# Patient Record
Sex: Female | Born: 1970 | Race: White | Hispanic: No | Marital: Married | Smoking: Former smoker
Health system: Southern US, Community
[De-identification: ages and names within clinical notes are randomized; demographics above are authoritative.]

## PROBLEM LIST (undated history)

## (undated) DIAGNOSIS — Z973 Presence of spectacles and contact lenses: Secondary | ICD-10-CM

## (undated) DIAGNOSIS — R519 Headache, unspecified: Secondary | ICD-10-CM

## (undated) DIAGNOSIS — O039 Complete or unspecified spontaneous abortion without complication: Secondary | ICD-10-CM

## (undated) HISTORY — DX: Complete or unspecified spontaneous abortion without complication: O03.9

## (undated) HISTORY — PX: DILATION AND CURETTAGE OF UTERUS: SHX78

## (undated) HISTORY — DX: Hypercalcemia: E83.52

---
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
## 1988-11-28 HISTORY — PX: DILATION AND CURETTAGE OF UTERUS: SHX78

## 1998-08-24 ENCOUNTER — Other Ambulatory Visit: Admission: RE | Admit: 1998-08-24 | Discharge: 1998-08-24 | Payer: Self-pay | Admitting: Obstetrics and Gynecology

## 1998-11-28 HISTORY — PX: CHOLECYSTECTOMY: SHX55

## 1999-09-01 ENCOUNTER — Inpatient Hospital Stay (HOSPITAL_COMMUNITY): Admission: AD | Admit: 1999-09-01 | Discharge: 1999-09-03 | Payer: Self-pay | Admitting: Gynecology

## 1999-10-04 ENCOUNTER — Encounter: Admission: RE | Admit: 1999-10-04 | Discharge: 2000-01-02 | Payer: Self-pay | Admitting: Gynecology

## 1999-10-12 ENCOUNTER — Other Ambulatory Visit: Admission: RE | Admit: 1999-10-12 | Discharge: 1999-10-12 | Payer: Self-pay | Admitting: Gynecology

## 2000-10-30 ENCOUNTER — Other Ambulatory Visit: Admission: RE | Admit: 2000-10-30 | Discharge: 2000-10-30 | Payer: Self-pay | Admitting: Gynecology

## 2001-11-08 ENCOUNTER — Other Ambulatory Visit: Admission: RE | Admit: 2001-11-08 | Discharge: 2001-11-08 | Payer: Self-pay | Admitting: Gynecology

## 2001-11-28 DIAGNOSIS — O039 Complete or unspecified spontaneous abortion without complication: Secondary | ICD-10-CM

## 2001-11-28 HISTORY — DX: Complete or unspecified spontaneous abortion without complication: O03.9

## 2002-12-10 ENCOUNTER — Other Ambulatory Visit: Admission: RE | Admit: 2002-12-10 | Discharge: 2002-12-10 | Payer: Self-pay | Admitting: Gynecology

## 2003-01-29 ENCOUNTER — Ambulatory Visit (HOSPITAL_COMMUNITY): Admission: RE | Admit: 2003-01-29 | Discharge: 2003-01-29 | Payer: Self-pay | Admitting: Gynecology

## 2003-01-29 ENCOUNTER — Encounter (INDEPENDENT_AMBULATORY_CARE_PROVIDER_SITE_OTHER): Payer: Self-pay

## 2003-04-21 ENCOUNTER — Encounter: Payer: Self-pay | Admitting: Gynecology

## 2003-04-21 ENCOUNTER — Ambulatory Visit (HOSPITAL_COMMUNITY): Admission: RE | Admit: 2003-04-21 | Discharge: 2003-04-21 | Payer: Self-pay | Admitting: Gynecology

## 2003-12-05 ENCOUNTER — Ambulatory Visit (HOSPITAL_COMMUNITY): Admission: RE | Admit: 2003-12-05 | Discharge: 2003-12-05 | Payer: Self-pay | Admitting: Gynecology

## 2003-12-05 ENCOUNTER — Encounter (INDEPENDENT_AMBULATORY_CARE_PROVIDER_SITE_OTHER): Payer: Self-pay | Admitting: Specialist

## 2003-12-05 ENCOUNTER — Ambulatory Visit (HOSPITAL_BASED_OUTPATIENT_CLINIC_OR_DEPARTMENT_OTHER): Admission: RE | Admit: 2003-12-05 | Discharge: 2003-12-05 | Payer: Self-pay | Admitting: Gynecology

## 2004-04-29 ENCOUNTER — Other Ambulatory Visit: Admission: RE | Admit: 2004-04-29 | Discharge: 2004-04-29 | Payer: Self-pay | Admitting: Gynecology

## 2005-02-23 ENCOUNTER — Inpatient Hospital Stay (HOSPITAL_COMMUNITY): Admission: AD | Admit: 2005-02-23 | Discharge: 2005-02-25 | Payer: Self-pay | Admitting: Gynecology

## 2005-04-19 ENCOUNTER — Other Ambulatory Visit: Admission: RE | Admit: 2005-04-19 | Discharge: 2005-04-19 | Payer: Self-pay | Admitting: Gynecology

## 2006-05-03 ENCOUNTER — Other Ambulatory Visit: Admission: RE | Admit: 2006-05-03 | Discharge: 2006-05-03 | Payer: Self-pay | Admitting: Gynecology

## 2007-05-21 ENCOUNTER — Other Ambulatory Visit: Admission: RE | Admit: 2007-05-21 | Discharge: 2007-05-21 | Payer: Self-pay | Admitting: Gynecology

## 2008-05-23 ENCOUNTER — Other Ambulatory Visit: Admission: RE | Admit: 2008-05-23 | Discharge: 2008-05-23 | Payer: Self-pay | Admitting: Gynecology

## 2009-03-30 ENCOUNTER — Encounter: Admission: RE | Admit: 2009-03-30 | Discharge: 2009-03-30 | Payer: Self-pay | Admitting: Gynecology

## 2009-04-01 ENCOUNTER — Encounter: Admission: RE | Admit: 2009-04-01 | Discharge: 2009-04-01 | Payer: Self-pay | Admitting: Gynecology

## 2009-05-25 ENCOUNTER — Other Ambulatory Visit: Admission: RE | Admit: 2009-05-25 | Discharge: 2009-05-25 | Payer: Self-pay | Admitting: Gynecology

## 2009-05-25 ENCOUNTER — Encounter: Payer: Self-pay | Admitting: Gynecology

## 2009-05-25 ENCOUNTER — Ambulatory Visit: Payer: Self-pay | Admitting: Gynecology

## 2010-06-25 ENCOUNTER — Ambulatory Visit: Payer: Self-pay | Admitting: Gynecology

## 2010-06-25 ENCOUNTER — Other Ambulatory Visit: Admission: RE | Admit: 2010-06-25 | Discharge: 2010-06-25 | Payer: Self-pay | Admitting: Gynecology

## 2011-01-10 ENCOUNTER — Other Ambulatory Visit: Payer: Self-pay | Admitting: Gynecology

## 2011-01-10 DIAGNOSIS — Z1231 Encounter for screening mammogram for malignant neoplasm of breast: Secondary | ICD-10-CM

## 2011-01-20 ENCOUNTER — Ambulatory Visit: Payer: Self-pay

## 2011-04-15 NOTE — Op Note (Signed)
   NAME:  [facility], [place]                         ACCOUNT NO.:  [id number]   MEDICAL RECORD NO.:  [id number]                   PATIENT TYPE:  AMB   LOCATION:  SDC                                  FACILITY:  [facility]   PHYSICIAN:  Ivor Costa. Farrel Gobble, M.D.              DATE OF BIRTH:  November 07, 1971   DATE OF PROCEDURE:  01/29/2003  DATE OF DISCHARGE:                                 OPERATIVE REPORT   PREOPERATIVE DIAGNOSIS:  Missed abortion at 7+ weeks with twins.   POSTOPERATIVE DIAGNOSIS:  Missed abortion at 7+ weeks with twins.   PROCEDURE:  First trimester D&E.   SURGEON:  Ivor Costa. Farrel Gobble, M.D.   ANESTHESIA:  MAC and paracervical block.   ESTIMATED BLOOD LOSS:  Minimal.   FINDINGS:  Uterus was retroverted sounding 8 cm.   PATHOLOGY:  Uterine contents and tissue for chromosome studies.   DESCRIPTION OF PROCEDURE:  The patient was taken to the operating room.  IV  sedation was induced and the patient placed in the dorsal lithotomy  position, prepped and draped in the usual sterile fashion.  A bivalve  speculum was placed in the vagina.  The cervix was injected with 10 mL of  0.25% Marcaine solution and stabilized with a single-tooth tenaculum.  The  cervix was then gently dilated up to 20 [place].  It sounded to 8 cm.  Suction curette was then advanced through the cervix and the cavity was  cleared of contents.  The gentle uterine curetting afterwards revealed good  cry on all four walls and fundus.  The suction curette was then readvanced  and no residual tissue was noted.  The instruments were removed from the  vagina.  The uterus was massaged while the patient received Methergine IM.  She was then transferred to the PACU in stable condition.                                               Ivor Costa. Farrel Gobble, M.D.    Leda Roys  D:  01/29/2003  T:  01/29/2003  Job:  [id number]

## 2011-04-15 NOTE — H&P (Signed)
NAME:  [facility]                         ACCOUNT NO.:  [id number]   MEDICAL RECORD NO.:  [id number]                   PATIENT TYPE:   LOCATION:                                       FACILITY:   PHYSICIAN:  Ivor Costa. Farrel Gobble, M.D.              DATE OF BIRTH:   DATE OF ADMISSION:  01/29/2003  DATE OF DISCHARGE:                                HISTORY & PHYSICAL   HISTORY OF PRESENT ILLNESS:  The patient is a 40 year old G3, P1, with an  LMP of 11/25/2002, estimated gestational age of 45 and 1/7 weeks, who was  noted to have monochorionic diamniotic twin gestation.  The patient reported  a small amount of brown spotting ten days ago and had an ultrasound that  showed two viable pregnancies with a small subchorionic hemorrhage.  Fetal  heart tones of 133 and 137 were seen.  She was given RhoGAM  prophylactically.  She was asked to come back in today to follow up on  subchorionic bleed.  The patient reports two days previously having a small  amount of brown spotting and generally not feeling well today.  She had some  red blood when she wiped.  Unfortunately her ultrasound done today showed  fetal demise x 2 with no fetal heart tones seen.  Of note, there was  increased nuchal thickness noted on both gestations.   PAST OB/GYN HISTORY:  She has regular periods.  She continues to have  difficulty conceiving but conceived spontaneously.  No abnormal Pap smears.  Her Rh is A negative.  Her husband is positive.  She had a spontaneous  delivery that was uncomplicated in 2000.  Her son currently ALL.   PAST MEDICAL HISTORY:  Negative.   PAST SURGICAL HISTORY:  She had a tonsillectomy in 1977 and a  cholecystectomy in 1992.   MEDICATIONS:  Prenatal vitamins.   ALLERGIES:  None.   SOCIAL HISTORY:  Negative.   FAMILY HISTORY:  Negative for cancer and recurrent ABs.   PHYSICAL EXAMINATION:  GENERAL: She is a well-appearing female in no acute  distress.  HEART:  Regular rate.  LUNGS:  Clear.  ABDOMEN:  Soft without rebound or guarding.  PELVIC:  On GYN exam, cervical motion tenderness, scant amount of blood.  The uterus was approximately 12-week size, mobile, and nontender.   ASSESSMENT:  1. Missed abortion of twin gestation at nine plus weeks.  2. Rh negative.    PLAN:  We will plan on doing D&C.  Will send sample tissue for chromosome  analysis.  She is agreeable to plan of care, and she will present in the  morning of 01/29/2003 for surgery.  Ivor Costa. Farrel Gobble, M.D.    THL/MEDQ  D:  01/28/2003  T:  01/28/2003  Job:  [id number]

## 2011-04-15 NOTE — H&P (Signed)
NAME:  Madeline Rodgers, Madeline Rodgers NO.:  [id number]   MEDICAL RECORD NO.:  [id number]                   PATIENT TYPE:   LOCATION:                                       FACILITY:   PHYSICIAN:  Juan H. Lily Peer, M.D.             DATE OF BIRTH:   DATE OF ADMISSION:  DATE OF DISCHARGE:                                HISTORY & PHYSICAL   DATE OF SURGERY:  Patient is scheduled for surgery at [facility] on December 05, 2003 at 8:30 A.M.   CHIEF COMPLAINT:  Recurrent pregnancy loses and intrauterine synechiae.   HISTORY OF PRESENT ILLNESS:  The patient is a 40 year old gravida 4, para 1,  AB 3 with history of recurrent pregnancy loses. Her first pregnancy was an  elective abortion.  The second pregnancy was a term pregnancy and the third  pregnancy was a missed abortion of twins requiring D and E and the fourth  pregnancy was spontaneously aborted.  Her work up has consisted of  antiphospholipid antibodies, IgG, IgM, lupus anticoagulant which were  negative.  She has had a karyotype which was normal.  Her husband recently  had his karyotype and results are pending at the time of this dictation.  She had an ultrasound on November 07, 2003 which was essentially  unremarkable with the exception of the saline-infusion sonohysterogram which  demonstrated irregular distention of the fundal cavity with a scarring or  synechiae in the fundal region, would not expand with saline but no  intracavitary masses were noted.  The patient was scheduled to undergo  diagnostic hysteroscopy and possible hysteroscopic lysis of intrauterine  adhesions and/or biopsy in an outpatient setting.  Literature information  from the [employer] [facility] had previously been presented to the  patient and she was seen in the office on December 04, 2003 for preoperative  counseling and had a Laminaria that was placed intracervically to facilitate  insertion of the operative  hysteroscope tomorrow during surgery.   PAST MEDICAL HISTORY:   ALLERGIES:  The patient denies any allergies.   PAST SURGICAL HISTORY:  The patient has had a tonsillectomy in 1977,  cholecystectomy in 2000.   FAMILY HISTORY:  Maternal grandmother with diabetes.   SOCIAL HISTORY:  Patient is currently using barrier contraception.   PHYSICAL EXAMINATION:  VITAL SIGNS:  Patient weight 158 pounds. Height 5  feet 8 inches tall, blood pressure 102/74.  HEENT: Unremarkable.  NECK:  Supple.  Trachea midline.  No carotid bruits. No thyromegaly.  LUNGS:  Clear to auscultation without rhonchi or wheezes.  HEART:  Regular rate and rhythm with no murmurs or gallops.  BREAST:  Examination done at the time of her annual examination in January  of 2004.  ABDOMEN:  Soft, nontender without rebound or guarding.  PELVIC:  Bartholin's, Skene's, urethral glands within normal limits.  Vagina  and cervix with no lesions or discharge.  Uterus  slightly retroverted.  Adnexa without palpable masses or tenderness.  Rectal examination deferred.  Patient had Laminaria intracervically placed.   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   PLAN:  Patient is to undergo a diagnostic hysteroscopy and possible  intrauterine adhesiolysis.  The risks, benefits and pros and cons of the  operation were discussed including infection, although she will receive  prophylaxis antibiotic, perforation of the uterus during instrumentation,  the risk of hemorrhage requiring blood transfusion with the risk of  anaphylactic reaction, hepatitis and AIDS were all discussed.  Also the risk  of fluid overload, pulmonary embolism and even death.  All these issues were  discussed with the patient.  She understands and would like to proceed with  the above-mentioned procedure and will follow accordingly.  Her last   menstrual period was reported for January 26th and as part of her  preoperative labs she will have a CBC, urinalysis, Pap smear, urine  pregnancy test and electrolytes.  Plan as per assessment above.                                               Juan H. Lily Peer, M.D.    JHF/MEDQ  D:  12/04/2003  T:  12/04/2003  Job:  [id number]

## 2011-04-15 NOTE — Op Note (Signed)
NAME:  Madeline Rodgers, Madeline Rodgers                       ACCOUNT NO.:  [id number]   MEDICAL RECORD NO.:  [id number]                   PATIENT TYPE:  AMB   LOCATION:  [facility]                                 [facility]ACILITY:  [facility]   PHYSICIAN:  Juan H. Lily Peer, M.D.             DATE OF BIRTH:  Oct 24, 1971   DATE OF PROCEDURE:  12/05/2003  DATE OF DISCHARGE:                                 OPERATIVE REPORT   INDICATIONS FOR PROCEDURE:  A 40 year old, gravida 4, para 1, AB 3 with  history of recurrent pregnancy losses.  Sonohysterogram in the office  demonstrated intrauterine synechia.   PREOPERATIVE DIAGNOSES:  1. Recurrent pregnancy losses.  2. Intrauterine synechia.   POSTOPERATIVE DIAGNOSES:  1. Recurrent pregnancy losses.  2. Intrauterine synechia.   ANESTHESIA:  General endotracheal anesthesia.   PROCEDURE:  1. Diagnostic hysteroscopy.  2. Lysis of intrauterine adhesions.   DESCRIPTION OF PROCEDURE:  After the patient was adequately counseled, she  was taken to the operating room where she underwent a successful general  endotracheal anesthesia.  She was placed in low lithotomy position. She had  received a gram of Cefotan prophylactically before commencement of the  operation.  The Laminaria that had previously been placed intracervically in  the office the day before in an effort to facilitate the hysteroscopic  procedure was removed in the operating room prior to commencement of the  operation.  The cervix and vagina was cleansed with Betadine solution, a red  rubber Roxan Hockey was used to evacuate the bladder of its contents for  approximately 75 mL.  Examination under anesthesia demonstrated a normal  size uterus, retroverted with no palpable adnexal masses. A long weighted  speculum was inserted into the vaginal vault along with a Simms retractor  for exposure. The anterior cervical lip was grasped with a single tooth  tenaculum.  The uterus was retroverted and it sounded to  approximately 7-8  cm.  The diagnostic hysteroscope with 3% sorbitol as the distending media  was utilized to visualize the cavity. There was a significant amount of  intrauterine synechia that were noted and due to the fact that the patient  is in proliferative phase of her cycle, enlarged endometrium was starting to  become evident. This required switching the hysteroscope to the operative  coiled Storz resectoscope with a 90 degree wire loop and with a [facility] generator set at 70 watts in cutting and 70 on the  coagulation mode.  The intrauterine pressure was kept between 90-100 mmHg  and the operative resectoscope facilitated the removal of small polypoid  like lesions.  A thick band was noted in the fundal aspect of the uterus  which was transected and representative portion was submitted for  histological evaluation. The right tubal ostia was identified and left tubal  ostia due to the lush endometrium was difficult to visualize.  Pre and post  pictures were obtained and a copy  will be kept in the patient's hospital  record and a second set will be kept in the office to show the patient after  surgery.  The endocervical canal was smooth, the fluid deficit from the 3%  sorbitol distending medium was only 20 mL and IV fluids were 700 mL and  lactated Ringer's.  For expansion of the intrauterine cavity, a 30 mL Foley  catheter with the tip cut off was placed into the uterus for additional  tamponade and will be left with  the patient in the recovery room for 2-3 hours before discharge.  The  patient was extubated and transferred to the recovery room with stable vital  signs.  Blood loss was minimal. Fluid resuscitation consisted of 700 mL of  lactated Ringer's.                                               Juan H. Lily Peer, M.D.    JHF/MEDQ  D:  12/05/2003  T:  12/05/2003  Job:  [id number]

## 2011-05-02 ENCOUNTER — Ambulatory Visit
Admission: RE | Admit: 2011-05-02 | Discharge: 2011-05-02 | Disposition: A | Discharge status: Home / Self Care | Payer: BC Managed Care – PPO | Source: Ambulatory Visit | Attending: Gynecology | Admitting: Gynecology

## 2011-05-02 DIAGNOSIS — Z1231 Encounter for screening mammogram for malignant neoplasm of breast: Secondary | ICD-10-CM

## 2011-06-03 ENCOUNTER — Encounter: Payer: Self-pay | Admitting: *Deleted

## 2012-05-10 ENCOUNTER — Encounter: Payer: BC Managed Care – PPO | Admitting: Gynecology

## 2012-05-10 ENCOUNTER — Ambulatory Visit (INDEPENDENT_AMBULATORY_CARE_PROVIDER_SITE_OTHER): Payer: BC Managed Care – PPO | Admitting: Gynecology

## 2012-05-10 ENCOUNTER — Encounter: Payer: Self-pay | Admitting: Gynecology

## 2012-05-10 VITALS — BP 112/78 | Ht 68.25 in | Wt 162.0 lb

## 2012-05-10 DIAGNOSIS — Z01419 Encounter for gynecological examination (general) (routine) without abnormal findings: Secondary | ICD-10-CM

## 2012-05-10 DIAGNOSIS — N83201 Unspecified ovarian cyst, right side: Secondary | ICD-10-CM | POA: Insufficient documentation

## 2012-05-10 DIAGNOSIS — M549 Dorsalgia, unspecified: Secondary | ICD-10-CM

## 2012-05-10 DIAGNOSIS — N83209 Unspecified ovarian cyst, unspecified side: Secondary | ICD-10-CM

## 2012-05-10 LAB — COMPREHENSIVE METABOLIC PANEL
ALT: 15 U/L (ref 0–35)
AST: 20 U/L (ref 0–37)
Alkaline Phosphatase: 33 U/L — ABNORMAL LOW (ref 39–117)
BUN: 13 mg/dL (ref 6–23)
Creat: 0.88 mg/dL (ref 0.50–1.10)
Potassium: 4.9 mEq/L (ref 3.5–5.3)

## 2012-05-10 LAB — CBC WITH DIFFERENTIAL/PLATELET
Basophils Absolute: 0 10*3/uL (ref 0.0–0.1)
Basophils Relative: 1 % (ref 0–1)
Eosinophils Relative: 2 % (ref 0–5)
HCT: 44.4 % (ref 36.0–46.0)
MCHC: 33.3 g/dL (ref 30.0–36.0)
Monocytes Absolute: 0.5 10*3/uL (ref 0.1–1.0)
Neutro Abs: 4.1 10*3/uL (ref 1.7–7.7)
Platelets: 304 10*3/uL (ref 150–400)
RDW: 13.7 % (ref 11.5–15.5)
WBC: 6.4 10*3/uL (ref 4.0–10.5)

## 2012-05-10 NOTE — Progress Notes (Signed)
Madeline Rodgers [birth year] [id number]   History:    41 y.o.  for annual gyn exam who has not been seen in the office since 2011. She has always had normal Pap smears. She is no longer on oral contraceptive pills. She is using condoms for contraception. Her cycles are reported to be regular. She plays tennis regularly and at times has complained of low back discomfort. No fever chills nausea vomiting or dysuria or frequency reported. Review of her records indicated that in 2011 she had an ultrasound that demonstrated that she had a right ovarian complex thick walled vascular cyst measuring 23 x 21 x 17 mm it was instructed to return back in 3 months for followup ultrasound and she did not.  Past medical history,surgical history, family history and social history were all reviewed and documented in the EPIC chart.  Gynecologic History Patient's last menstrual period was 04/19/2012. Contraception: condoms Last Pap: 2011. Results were: normal Last mammogram: 2012. Results were: normal  Obstetric History OB History    Grav Para Term Preterm Abortions TAB SAB Ect Mult Living   4 2 2  1  1   2      # Outc Date GA Lbr Len/2nd Wgt Sex Del Anes PTL Lv   1 GRA            2 TRM     M SVD  No Yes   3 TRM     M SVD  No Yes   4 SAB                ROS: A ROS was performed and pertinent positives and negatives are included in the history.  GENERAL: No fevers or chills. HEENT: No change in vision, no earache, sore throat or sinus congestion. NECK: No pain or stiffness. CARDIOVASCULAR: No chest pain or pressure. No palpitations. PULMONARY: No shortness of breath, cough or wheeze. GASTROINTESTINAL: No abdominal pain, nausea, vomiting or diarrhea, melena or bright red blood per rectum. GENITOURINARY: No urinary frequency, urgency, hesitancy or dysuria. MUSCULOSKELETAL: No joint or muscle pain, no back pain, no recent trauma. DERMATOLOGIC: No rash, no itching, no lesions. ENDOCRINE: No polyuria, polydipsia, no  heat or cold intolerance. No recent change in weight. HEMATOLOGICAL: No anemia or easy bruising or bleeding. NEUROLOGIC: No headache, seizures, numbness, tingling or weakness. PSYCHIATRIC: No depression, no loss of interest in normal activity or change in sleep pattern.     Exam: chaperone present  BP 112/78  Ht 5' 8.25" (1.734 m)  Wt 162 lb (73.483 kg)  BMI 24.45 kg/m2  LMP 04/19/2012  Body mass index is 24.45 kg/(m^2).  General appearance : Well developed well nourished female. No acute distress HEENT: Neck supple, trachea midline, no carotid bruits, no thyroidmegaly Lungs: Clear to auscultation, no rhonchi or wheezes, or rib retractions  Heart: Regular rate and rhythm, no murmurs or gallops Breast:Examined in sitting and supine position were symmetrical in appearance, no palpable masses or tenderness,  no skin retraction, no nipple inversion, no nipple discharge, no skin discoloration, no axillary or supraclavicular lymphadenopathy Abdomen: no palpable masses or tenderness, no rebound or guarding Extremities: no edema or skin discoloration or tenderness  Pelvic:  Bartholin, Urethra, Skene Glands: Within normal limits             Vagina: No gross lesions or discharge  Cervix: No gross lesions or discharge  Uterus  anteverted, normal size, shape and consistency, non-tender and mobile  Adnexa  Without masses or tenderness  Anus and perineum  normal   Rectovaginal  normal sphincter tone without palpated masses or tenderness             Hemoccult not done     Assessment/Plan:  41 y.o. female for annual exam will return to the office in 2-3 weeks for followup ultrasound. We discussed new screening guidelines her Pap smears she will not need one until next year. The following labs were drawn today: CBC, cholesterol, comprehensive metabolic, urinalysis, and TSH. Requisition to schedule mammogram was provided. She was instructed to continue to do her monthly self breast examination. We  discussed importance of calcium and vitamin D for osteoporosis prevention.    Ok Edwards MD, 11:09 AM 05/10/2012

## 2012-05-10 NOTE — Addendum Note (Signed)
Addended by: Bertram Savin A on: 05/10/2012 11:32 AM   Modules accepted: Orders

## 2012-05-10 NOTE — Patient Instructions (Addendum)
Ovarian Cyst The ovaries are small organs that are on each side of the uterus. The ovaries are the organs that produce the female hormones, estrogen and progesterone. An ovarian cyst is a sac filled with fluid that can vary in its size. It is normal for a small cyst to form in women who are in the childbearing age and who have menstrual periods. This type of cyst is called a follicle cyst that becomes an ovulation cyst (corpus luteum cyst) after it produces the women's egg. It later goes away on its own if the woman does not become pregnant. There are other kinds of ovarian cysts that may cause problems and may need to be treated. The most serious problem is a cyst with cancer. It should be noted that menopausal women who have an ovarian cyst are at a higher risk of it being a cancer cyst. They should be evaluated very quickly, thoroughly and followed closely. This is especially true in menopausal women because of the high rate of ovarian cancer in women in menopause. CAUSES AND TYPES OF OVARIAN CYSTS:  FUNCTIONAL CYST: The follicle/corpus luteum cyst is a functional cyst that occurs every month during ovulation with the menstrual cycle. They go away with the next menstrual cycle if the woman does not get pregnant. Usually, there are no symptoms with a functional cyst.   ENDOMETRIOMA CYST: This cyst develops from the lining of the uterus tissue. This cyst gets in or on the ovary. It grows every month from the bleeding during the menstrual period. It is also called a "chocolate cyst" because it becomes filled with blood that turns brown. This cyst can cause pain in the lower abdomen during intercourse and with your menstrual period.   CYSTADENOMA CYST: This cyst develops from the cells on the outside of the ovary. They usually are not cancerous. They can get very big and cause lower abdomen pain and pain with intercourse. This type of cyst can twist on itself, cut off its blood supply and cause severe pain.  It also can easily rupture and cause a lot of pain.   DERMOID CYST: This type of cyst is sometimes found in both ovaries. They are found to have different kinds of body tissue in the cyst. The tissue includes skin, teeth, hair, and/or cartilage. They usually do not have symptoms unless they get very big. Dermoid cysts are rarely cancerous.   POLYCYSTIC OVARY: This is a rare condition with hormone problems that produces many small cysts on both ovaries. The cysts are follicle-like cysts that never produce an egg and become a corpus luteum. It can cause an increase in body weight, infertility, acne, increase in body and facial hair and lack of menstrual periods or rare menstrual periods. Many women with this problem develop type 2 diabetes. The exact cause of this problem is unknown. A polycystic ovary is rarely cancerous.   THECA LUTEIN CYST: Occurs when too much hormone (human chorionic gonadotropin) is produced and over-stimulates the ovaries to produce an egg. They are frequently seen when doctors stimulate the ovaries for invitro-fertilization (test tube babies).   LUTEOMA CYST: This cyst is seen during pregnancy. Rarely it can cause an obstruction to the birth canal during labor and delivery. They usually go away after delivery.  SYMPTOMS   Pelvic pain or pressure.   Pain during sexual intercourse.   Increasing girth (swelling) of the abdomen.   Abnormal menstrual periods.   Increasing pain with menstrual periods.   You stop having   menstrual periods and you are not pregnant.  DIAGNOSIS  The diagnosis can be made during:  Routine or annual pelvic examination (common).   Ultrasound.   X-ray of the pelvis.   CT Scan.   MRI.   Blood tests.  TREATMENT   Treatment may only be to follow the cyst monthly for 2 to 3 months with your caregiver. Many go away on their own, especially functional cysts.   May be aspirated (drained) with a long needle with ultrasound, or by laparoscopy  (inserting a tube into the pelvis through a small incision).   The whole cyst can be removed by laparoscopy.   Sometimes the cyst may need to be removed through an incision in the lower abdomen.   Hormone treatment is sometimes used to help dissolve certain cysts.   Birth control pills are sometimes used to help dissolve certain cysts.  HOME CARE INSTRUCTIONS  Follow your caregiver's advice regarding:  Medicine.   Follow up visits to evaluate and treat the cyst.   You may need to come back or make an appointment with another caregiver, to find the exact cause of your cyst, if your caregiver is not a gynecologist.   Get your yearly and recommended pelvic examinations and Pap tests.   Let your caregiver know if you have had an ovarian cyst in the past.  SEEK MEDICAL CARE IF:   Your periods are late, irregular, they stop, or are painful.   Your stomach (abdomen) or pelvic pain does not go away.   Your stomach becomes larger or swollen.   You have pressure on your bladder or trouble emptying your bladder completely.   You have painful sexual intercourse.   You have feelings of fullness, pressure, or discomfort in your stomach.   You lose weight for no apparent reason.   You feel generally ill.   You become constipated.   You lose your appetite.   You develop acne.   You have an increase in body and facial hair.   You are gaining weight, without changing your exercise and eating habits.   You think you are pregnant.  SEEK IMMEDIATE MEDICAL CARE IF:   You have increasing abdominal pain.   You feel sick to your stomach (nausea) and/or vomit.   You develop a fever that comes on suddenly.   You develop abdominal pain during a bowel movement.   Your menstrual periods become heavier than usual.  Document Released: 11/14/2005 Document Revised: 11/03/2011 Document Reviewed: 09/17/2009 [employer] Patient Information 2012 [employer]. 

## 2012-05-11 ENCOUNTER — Other Ambulatory Visit: Payer: Self-pay | Admitting: *Deleted

## 2012-05-11 DIAGNOSIS — E7800 Pure hypercholesterolemia, unspecified: Secondary | ICD-10-CM

## 2012-05-11 LAB — URINALYSIS W MICROSCOPIC + REFLEX CULTURE
Casts: NONE SEEN
Crystals: NONE SEEN
Glucose, UA: NEGATIVE mg/dL
Leukocytes, UA: NEGATIVE
Squamous Epithelial / LPF: NONE SEEN
pH: 6 (ref 5.0–8.0)

## 2012-05-30 ENCOUNTER — Other Ambulatory Visit: Payer: Self-pay | Admitting: Gynecology

## 2012-05-30 DIAGNOSIS — Z1231 Encounter for screening mammogram for malignant neoplasm of breast: Secondary | ICD-10-CM

## 2012-06-04 ENCOUNTER — Ambulatory Visit (INDEPENDENT_AMBULATORY_CARE_PROVIDER_SITE_OTHER): Payer: BC Managed Care – PPO

## 2012-06-04 ENCOUNTER — Ambulatory Visit (INDEPENDENT_AMBULATORY_CARE_PROVIDER_SITE_OTHER): Payer: BC Managed Care – PPO | Admitting: Gynecology

## 2012-06-04 ENCOUNTER — Encounter: Payer: Self-pay | Admitting: Gynecology

## 2012-06-04 ENCOUNTER — Ambulatory Visit
Admission: RE | Admit: 2012-06-04 | Discharge: 2012-06-04 | Disposition: A | Discharge status: Home / Self Care | Payer: BC Managed Care – PPO | Source: Ambulatory Visit | Attending: Gynecology | Admitting: Gynecology

## 2012-06-04 VITALS — BP 124/82

## 2012-06-04 DIAGNOSIS — D251 Intramural leiomyoma of uterus: Secondary | ICD-10-CM

## 2012-06-04 DIAGNOSIS — D259 Leiomyoma of uterus, unspecified: Secondary | ICD-10-CM

## 2012-06-04 DIAGNOSIS — Z1231 Encounter for screening mammogram for malignant neoplasm of breast: Secondary | ICD-10-CM

## 2012-06-04 DIAGNOSIS — N83201 Unspecified ovarian cyst, right side: Secondary | ICD-10-CM

## 2012-06-04 DIAGNOSIS — N854 Malposition of uterus: Secondary | ICD-10-CM

## 2012-06-04 DIAGNOSIS — N83209 Unspecified ovarian cyst, unspecified side: Secondary | ICD-10-CM

## 2012-06-04 DIAGNOSIS — D219 Benign neoplasm of connective and other soft tissue, unspecified: Secondary | ICD-10-CM

## 2012-06-04 NOTE — Progress Notes (Signed)
Patient was seen the office on June 13 for an overdue annual exam. But at the time of her records were reviewed and indicated that in 2011 she had an ultrasound that demonstrated that she had a right ovarian complex thick walled vascular cyst measuring 23 x 21 x 17 mm it was instructed to return back in 3 months for followup ultrasound and she did not.  Patient returned today for followup for sound and the following were the findings: Uterus measured 8.5 x 6.2 x 5.1 cm with an endometrial stripe of 5.7 mm patient is currently on day 14 of her cycle. She has a left thick wall collapse corpus luteum cyst measures 17 x 15 x 40 mm. Right ovary with follicles no abnormality noted. 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  Her recent labs and care total cholesterol was elevated 203 and she will return back later this week for fasting lipid profile. We will otherwise see her next year or when necessary. She was reassured and information provided on fibroids.

## 2012-06-04 NOTE — Patient Instructions (Addendum)
Fibroids You have been diagnosed as having a fibroid. Fibroids are smooth muscle lumps (tumors) which can occur any place in a woman's body. They are usually in the womb (uterus). The most common problem (symptom) of fibroids is bleeding. Over time this may cause low red blood cells (anemia). Other symptoms include feelings of pressure and pain in the pelvis. The diagnosis (learning what is wrong) of fibroids is made by physical exam. Sometimes tests such as an ultrasound are used. This is helpful when fibroids are felt around the ovaries and to look for tumors. TREATMENT   Most fibroids do not need surgical or medical treatment. Sometimes a tissue sample (biopsy) of the lining of the uterus is done to rule out cancer. If there is no cancer and only a small amount of bleeding, the problem can be watched.   Hormonal treatment can improve the problem.   When surgery is needed, it can consist of removing the fibroid. Vaginal birth may not be possible after the removal of fibroids. This depends on where they are and the extent of surgery. When pregnancy occurs with fibroids it is usually normal.   Your caregiver can help decide which treatments are best for you.  HOME CARE INSTRUCTIONS   Do not use aspirin as this may increase bleeding problems.   If your periods (menses) are heavy, record the number of pads or tampons used per month. Bring this information to your caregiver. This can help them determine the best treatment for you.  SEEK IMMEDIATE MEDICAL CARE IF:  You have pelvic pain or cramps not controlled with medications, or experience a sudden increase in pain.   You have an increase of pelvic bleeding between and during menses.   You feel lightheaded or have fainting spells.   You develop worsening belly (abdominal) pain.  Document Released: 11/11/2000 Document Revised: 11/03/2011 Document Reviewed: 07/03/2008 Exit[employer] Patient Information 2012 [employer].    

## 2013-01-24 ENCOUNTER — Ambulatory Visit: Payer: BC Managed Care – PPO | Admitting: Gynecology

## 2013-07-05 ENCOUNTER — Encounter: Payer: Self-pay | Admitting: Gynecology

## 2013-07-30 ENCOUNTER — Other Ambulatory Visit: Payer: Self-pay

## 2013-07-30 DIAGNOSIS — Z1231 Encounter for screening mammogram for malignant neoplasm of breast: Secondary | ICD-10-CM

## 2013-08-20 ENCOUNTER — Encounter: Payer: Self-pay | Admitting: Gynecology

## 2013-08-27 ENCOUNTER — Ambulatory Visit: Payer: BC Managed Care – PPO

## 2013-08-30 ENCOUNTER — Other Ambulatory Visit (HOSPITAL_COMMUNITY)
Admission: RE | Admit: 2013-08-30 | Discharge: 2013-08-30 | Disposition: A | Discharge status: Home / Self Care | Payer: BC Managed Care – PPO | Source: Ambulatory Visit | Attending: Gynecology | Admitting: Gynecology

## 2013-08-30 ENCOUNTER — Encounter: Payer: Self-pay | Admitting: Gynecology

## 2013-08-30 ENCOUNTER — Ambulatory Visit (INDEPENDENT_AMBULATORY_CARE_PROVIDER_SITE_OTHER): Payer: BC Managed Care – PPO | Admitting: Gynecology

## 2013-08-30 VITALS — BP 124/78 | Ht 68.0 in | Wt 169.0 lb

## 2013-08-30 DIAGNOSIS — Z23 Encounter for immunization: Secondary | ICD-10-CM

## 2013-08-30 DIAGNOSIS — Z1151 Encounter for screening for human papillomavirus (HPV): Secondary | ICD-10-CM | POA: Insufficient documentation

## 2013-08-30 DIAGNOSIS — Z01419 Encounter for gynecological examination (general) (routine) without abnormal findings: Secondary | ICD-10-CM

## 2013-08-30 DIAGNOSIS — R635 Abnormal weight gain: Secondary | ICD-10-CM

## 2013-08-30 DIAGNOSIS — N898 Other specified noninflammatory disorders of vagina: Secondary | ICD-10-CM

## 2013-08-30 DIAGNOSIS — L292 Pruritus vulvae: Secondary | ICD-10-CM

## 2013-08-30 DIAGNOSIS — L293 Anogenital pruritus, unspecified: Secondary | ICD-10-CM

## 2013-08-30 LAB — COMPREHENSIVE METABOLIC PANEL
Alkaline Phosphatase: 34 U/L — ABNORMAL LOW (ref 39–117)
BUN: 14 mg/dL (ref 6–23)
Glucose, Bld: 87 mg/dL (ref 70–99)
Total Bilirubin: 0.5 mg/dL (ref 0.3–1.2)

## 2013-08-30 LAB — WET PREP FOR TRICH, YEAST, CLUE
Clue Cells Wet Prep HPF POC: NONE SEEN
Clue Cells Wet Prep HPF POC: NONE SEEN
Trich, Wet Prep: NONE SEEN
Yeast Wet Prep HPF POC: NONE SEEN

## 2013-08-30 LAB — CBC WITH DIFFERENTIAL/PLATELET
Basophils Relative: 0 % (ref 0–1)
Eosinophils Absolute: 0.1 10*3/uL (ref 0.0–0.7)
Hemoglobin: 13.6 g/dL (ref 12.0–15.0)
MCH: 32 pg (ref 26.0–34.0)
MCHC: 34.8 g/dL (ref 30.0–36.0)
Monocytes Absolute: 0.5 10*3/uL (ref 0.1–1.0)
Monocytes Relative: 9 % (ref 3–12)
Neutrophils Relative %: 63 % (ref 43–77)

## 2013-08-30 LAB — TSH: TSH: 1.042 u[IU]/mL (ref 0.350–4.500)

## 2013-08-30 MED ORDER — FLUCONAZOLE 150 MG PO TABS: 150.0000 mg | ORAL_TABLET | Freq: Once | ORAL | Status: DC

## 2013-08-30 NOTE — Patient Instructions (Addendum)
Influenza Vaccine (Flu Vaccine, Inactivated) 2013 2014 What You Need to Know WHY GET VACCINATED?  Influenza ("flu") is a contagious disease that spreads around the [place] every winter, usually between October and May.  Flu is caused by the influenza virus, and can be spread by coughing, sneezing, and close contact.  Anyone can get flu, but the risk of getting flu is highest among children. Symptoms come on suddenly and may last several days. They can include:  Fever or chills.  Sore throat.  Muscle aches.  Fatigue.  Cough.  Headache.  Runny or stuffy nose. Flu can make some people much sicker than others. These people include young children, people 65 and older, pregnant women, and people with certain health conditions such as heart, lung or kidney disease, or a weakened immune system. Flu vaccine is especially important for these people, and anyone in close contact with them. Flu can also lead to pneumonia, and make existing medical conditions worse. It can cause diarrhea and seizures in children. Each year thousands of people in the [place] die from flu, and many more are hospitalized. Flu vaccine is the best protection we have from flu and its complications. Flu vaccine also helps prevent spreading flu from person to person. INACTIVATED FLU VACCINE There are 2 types of influenza vaccine:  You are getting an inactivated flu vaccine, which does not contain any live influenza virus. It is given by injection with a needle, and often called the "flu shot."  A different live, attenuated (weakened) influenza vaccine is sprayed into the nostrils. This vaccine is described in a separate Vaccine Information Statement. Flu vaccine is recommended every year. Children 6 months through 8 years of age should get 2 doses the first year they get vaccinated. Flu viruses are always changing. Each year's flu vaccine is made to protect from viruses that are most likely to cause disease  that year. While flu vaccine cannot prevent all cases of flu, it is our best defense against the disease. Inactivated flu vaccine protects against 3 or 4 different influenza viruses. It takes about 2 weeks for protection to develop after the vaccination, and protection lasts several months to a year. Some illnesses that are not caused by influenza virus are often mistaken for flu. Flu vaccine will not prevent these illnesses. It can only prevent influenza. A "high-dose" flu vaccine is available for people 65 years of age and older. The person giving you the vaccine can tell you more about it. Some inactivated flu vaccine contains a very small amount of a mercury-based preservative called thimerosal. Studies have shown that thimerosal in vaccines is not harmful, but flu vaccines that do not contain a preservative are available. SOME PEOPLE SHOULD NOT GET THIS VACCINE Tell the person who gives you the vaccine:  If you have any severe (life-threatening) allergies. If you ever had a life-threatening allergic reaction after a dose of flu vaccine, or have a severe allergy to any part of this vaccine, you may be advised not to get a dose. Most, but not all, types of flu vaccine contain a small amount of egg.  If you ever had Guillain Barr Syndrome (a severe paralyzing illness, also called GBS). Some people with a history of GBS should not get this vaccine. This should be discussed with your doctor.  If you are not feeling well. They might suggest waiting until you feel better. But you should come back. RISKS OF A VACCINE REACTION With a vaccine, like any medicine, there   is a chance of side effects. These are usually mild and go away on their own. Serious side effects are also possible, but are very rare. Inactivated flu vaccine does not contain live flu virus, sogetting flu from this vaccine is not possible. Brief fainting spells and related symptoms (such as jerking movements) can happen after any medical  procedure, including vaccination. Sitting or lying down for about 15 minutes after a vaccination can help prevent fainting and injuries caused by falls. Tell your doctor if you feel dizzy or lightheaded, or have vision changes or ringing in the ears. Mild problems following inactivated flu vaccine:  Soreness, redness, or swelling where the shot was given.  Hoarseness; sore, red or itchy eyes; or cough.  Fever.  Aches.  Headache.  Itching.  Fatigue. If these problems occur, they usually begin soon after the shot and last 1 or 2 days. Moderate problems following inactivated flu vaccine:  Young children who get inactivated flu vaccine and pneumococcal vaccine (PCV13) at the same time may be at increased risk for seizures caused by fever. Ask your doctor for more information. Tell your doctor if a child who is getting flu vaccine has ever had a seizure. Severe problems following inactivated flu vaccine:  A severe allergic reaction could occur after any vaccine (estimated less than 1 in a million doses).  There is a small possibility that inactivated flu vaccine could be associated with Guillan Barr Syndrome (GBS), no more than 1 or 2 cases per million people vaccinated. This is much lower than the risk of severe complications from flu, which can be prevented by flu vaccine. The safety of vaccines is always being monitored. For more information, visit: www.cdc.gov/vaccinesafety/ WHAT IF THERE IS A SERIOUS REACTION? What should I look for?  Look for anything that concerns you, such as signs of a severe allergic reaction, very high fever, or behavior changes. Signs of a severe allergic reaction can include hives, swelling of the face and throat, difficulty breathing, a fast heartbeat, dizziness, and weakness. These would start a few minutes to a few hours after the vaccination. What should I do?  If you think it is a severe allergic reaction or other emergency that cannot wait, call [phone] 1 1  or get the person to the nearest hospital. Otherwise, call your doctor.  Afterward, the reaction should be reported to the Vaccine Adverse Event Reporting System (VAERS). Your doctor might file this report, or you can do it yourself through the VAERS website at www.vaers.hhs.gov, or by calling 1-800-822-7967. VAERS is only for reporting reactions. They do not give medical advice. THE NATIONAL VACCINE INJURY COMPENSATION PROGRAM The National Vaccine Injury Compensation Program (VICP) is a federal program that was created to compensate people who may have been injured by certain vaccines. Persons who believe they may have been injured by a vaccine can learn about the program and about filing a claim by calling 1-800-338-2382 or visiting the VICP website at www.hrsa.gov/vaccinecompensation HOW CAN I LEARN MORE?  Ask your doctor.  Call your local or state health department.  Contact the Centers for Disease Control and Prevention (CDC):  Call 1-800-232-4636 (1-800-CDC-INFO) or  Visit CDC's website at www.cdc.gov/flu CDC Inactivated Influenza Vaccine Interim VIS (06/22/12) Document Released: 09/08/2006 Document Revised: 08/08/2012 Document Reviewed: 06/22/2012 [employer] Patient Information 2014 [employer].  

## 2013-08-30 NOTE — Progress Notes (Signed)
Madeline Rodgers 01-04-1971 [id number]   History:    42 y.o.  for annual gyn exam complaining of some vaginal irritation. She tried  Monistat for about 5 days but still has some other symptoms. Review of patient's records indicated she was weighing 162 and is up to 169. She has been off the oral contraceptive for 4 years and is using withdrawal for contraception. She reports normal menstrual cycles. Last mammogram 2013. Patient with no prior history of abnormal Pap smears.  Past medical history,surgical history, family history and social history were all reviewed and documented in the EPIC chart.  Gynecologic History Patient's last menstrual period was 08/25/2013. Contraception: none Last Pap: 2011. Results were: normal Last mammogram: 2013. Results were: normal  Obstetric History OB History  Gravida Para Term Preterm AB SAB TAB Ectopic Multiple Living  4 2 2  1 1    2     # Outcome Date GA Lbr Len/2nd Weight Sex Delivery Anes PTL Lv  4 SAB           3 TRM     M SVD  N Y  2 TRM     M SVD  N Y  1 GRA                ROS: A ROS was performed and pertinent positives and negatives are included in the history.  GENERAL: No fevers or chills. HEENT: No change in vision, no earache, sore throat or sinus congestion. NECK: No pain or stiffness. CARDIOVASCULAR: No chest pain or pressure. No palpitations. PULMONARY: No shortness of breath, cough or wheeze. GASTROINTESTINAL: No abdominal pain, nausea, vomiting or diarrhea, melena or bright red blood per rectum. GENITOURINARY: No urinary frequency, urgency, hesitancy or dysuria. MUSCULOSKELETAL: No joint or muscle pain, no back pain, no recent trauma. DERMATOLOGIC: No rash, no itching, no lesions. ENDOCRINE: No polyuria, polydipsia, no heat or cold intolerance. No recent change in weight. HEMATOLOGICAL: No anemia or easy bruising or bleeding. NEUROLOGIC: No headache, seizures, numbness, tingling or weakness. PSYCHIATRIC: No depression, no loss of interest  in normal activity or change in sleep pattern.   vulvar irritation  Exam: chaperone present  BP 124/78  Ht 5\' 8"  (1.727 m)  Wt 169 lb (76.658 kg)  BMI 25.7 kg/m2  LMP 08/25/2013  Body mass index is 25.7 kg/(m^2).  General appearance : Well developed well nourished female. No acute distress HEENT: Neck supple, trachea midline, no carotid bruits, no thyroidmegaly Lungs: Clear to auscultation, no rhonchi or wheezes, or rib retractions  Heart: Regular rate and rhythm, no murmurs or gallops Breast:Examined in sitting and supine position were symmetrical in appearance, no palpable masses or tenderness,  no skin retraction, no nipple inversion, no nipple discharge, no skin discoloration, no axillary or supraclavicular lymphadenopathy Abdomen: no palpable masses or tenderness, no rebound or guarding Extremities: no edema or skin discoloration or tenderness  Pelvic:  Bartholin, Urethra, Skene Glands: Within normal limits             Vagina: No gross lesions or discharge  Cervix: No gross lesions or discharge  Uterus  anteverted, normal size, shape and consistency, non-tender and mobile  Adnexa  Without masses or tenderness  Anus and perineum  normal   Rectovaginal  normal sphincter tone without palpated masses or tenderness             Hemoccult none indicated   Wet prep   Assessment/Plan:  42 y.o. female for annual exam with evidence of vaginal  monilial infection. Diflucan 150 mg was prescribed. Pap smear was done today. The following labs were ordered: CBC, conference metabolic panel, TSH and urinalysis. Patient received the flu vaccine today. Patient reminded to schedule her mammogram and to do her monthly breast exam. We discussed importance of calcium vitamin D and regular exercise for osteoporosis prevention.  Note: This dictation was prepared with  Dragon/digital dictation along withSmart phrase technology. Any transcriptional errors that result from this process are  unintentional.   Ok Edwards MD, 3:43 PM 08/30/2013

## 2013-08-30 NOTE — Addendum Note (Signed)
Addended by: Bertram Savin A on: 08/30/2013 04:38 PM   Modules accepted: Orders

## 2013-08-31 LAB — URINALYSIS W MICROSCOPIC + REFLEX CULTURE
Bacteria, UA: NONE SEEN
Bilirubin Urine: NEGATIVE
Casts: NONE SEEN
Crystals: NONE SEEN
Glucose, UA: NEGATIVE mg/dL
Hgb urine dipstick: NEGATIVE
Ketones, ur: NEGATIVE mg/dL
Protein, ur: NEGATIVE mg/dL
Squamous Epithelial / LPF: NONE SEEN
Urobilinogen, UA: 0.2 mg/dL (ref 0.0–1.0)

## 2013-09-04 ENCOUNTER — Ambulatory Visit: Payer: BC Managed Care – PPO

## 2013-09-17 ENCOUNTER — Ambulatory Visit
Admission: RE | Admit: 2013-09-17 | Discharge: 2013-09-17 | Disposition: A | Discharge status: Home / Self Care | Payer: BC Managed Care – PPO | Source: Ambulatory Visit

## 2013-09-17 DIAGNOSIS — Z1231 Encounter for screening mammogram for malignant neoplasm of breast: Secondary | ICD-10-CM

## 2013-12-31 ENCOUNTER — Telehealth: Payer: Self-pay

## 2013-12-31 MED ORDER — LEVONORGESTREL-ETHINYL ESTRAD 0.1-20 MG-MCG PO TABS: 1.0000 | ORAL_TABLET | Freq: Every day | ORAL | Status: DC

## 2013-12-31 NOTE — Telephone Encounter (Signed)
Please tell her that since she is 43 years of age we need to put her on a different oral contraceptive pill with lower estrogen to minimize her risk of a DVT or pulmonary embolism. Please call a prescription for Aviane 28 year oral contraceptive pill. Had her start the pill on the second day of her cycle. Call in one month supply with 11 refills

## 2013-12-31 NOTE — Telephone Encounter (Signed)
Patient informed. Rx sent 

## 2013-12-31 NOTE — Telephone Encounter (Signed)
Patient requesting Rx for generic Lo-Ovral.  Husband has not yet had vasectomy and she wants to restart OC's.

## 2014-08-15 ENCOUNTER — Other Ambulatory Visit: Payer: Self-pay

## 2014-08-15 DIAGNOSIS — Z1231 Encounter for screening mammogram for malignant neoplasm of breast: Secondary | ICD-10-CM

## 2014-09-19 ENCOUNTER — Ambulatory Visit
Admission: RE | Admit: 2014-09-19 | Discharge: 2014-09-19 | Disposition: A | Discharge status: Home / Self Care | Payer: BC Managed Care – PPO | Source: Ambulatory Visit

## 2014-09-19 ENCOUNTER — Encounter (INDEPENDENT_AMBULATORY_CARE_PROVIDER_SITE_OTHER): Payer: Self-pay

## 2014-09-19 DIAGNOSIS — Z1231 Encounter for screening mammogram for malignant neoplasm of breast: Secondary | ICD-10-CM

## 2014-09-29 ENCOUNTER — Encounter: Payer: Self-pay | Admitting: Gynecology

## 2014-10-07 ENCOUNTER — Encounter: Payer: Self-pay | Admitting: Gynecology

## 2014-10-07 ENCOUNTER — Ambulatory Visit (INDEPENDENT_AMBULATORY_CARE_PROVIDER_SITE_OTHER): Payer: BC Managed Care – PPO | Admitting: Gynecology

## 2014-10-07 ENCOUNTER — Other Ambulatory Visit (HOSPITAL_COMMUNITY)
Admission: RE | Admit: 2014-10-07 | Discharge: 2014-10-07 | Disposition: A | Discharge status: Home / Self Care | Payer: BC Managed Care – PPO | Source: Ambulatory Visit | Attending: Gynecology | Admitting: Gynecology

## 2014-10-07 VITALS — BP 126/78 | Ht 68.25 in | Wt 172.0 lb

## 2014-10-07 DIAGNOSIS — N939 Abnormal uterine and vaginal bleeding, unspecified: Secondary | ICD-10-CM

## 2014-10-07 DIAGNOSIS — N920 Excessive and frequent menstruation with regular cycle: Secondary | ICD-10-CM | POA: Insufficient documentation

## 2014-10-07 DIAGNOSIS — Z01419 Encounter for gynecological examination (general) (routine) without abnormal findings: Secondary | ICD-10-CM | POA: Diagnosis present

## 2014-10-07 DIAGNOSIS — N923 Ovulation bleeding: Secondary | ICD-10-CM

## 2014-10-07 DIAGNOSIS — Z23 Encounter for immunization: Secondary | ICD-10-CM

## 2014-10-07 LAB — CBC WITH DIFFERENTIAL/PLATELET
BASOS ABS: 0 10*3/uL (ref 0.0–0.1)
Basophils Relative: 1 % (ref 0–1)
Eosinophils Absolute: 0.1 10*3/uL (ref 0.0–0.7)
Eosinophils Relative: 2 % (ref 0–5)
HCT: 39.8 % (ref 36.0–46.0)
Hemoglobin: 13.3 g/dL (ref 12.0–15.0)
LYMPHS PCT: 25 % (ref 12–46)
Lymphs Abs: 1.2 10*3/uL (ref 0.7–4.0)
MCH: 30.6 pg (ref 26.0–34.0)
MCHC: 33.4 g/dL (ref 30.0–36.0)
MCV: 91.7 fL (ref 78.0–100.0)
Monocytes Absolute: 0.5 10*3/uL (ref 0.1–1.0)
Monocytes Relative: 10 % (ref 3–12)
NEUTROS ABS: 2.9 10*3/uL (ref 1.7–7.7)
NEUTROS PCT: 62 % (ref 43–77)
PLATELETS: 279 10*3/uL (ref 150–400)
RBC: 4.34 MIL/uL (ref 3.87–5.11)
RDW: 13.7 % (ref 11.5–15.5)
WBC: 4.6 10*3/uL (ref 4.0–10.5)

## 2014-10-07 LAB — COMPREHENSIVE METABOLIC PANEL
ALK PHOS: 38 U/L — AB (ref 39–117)
ALT: 12 U/L (ref 0–35)
AST: 16 U/L (ref 0–37)
Albumin: 4.7 g/dL (ref 3.5–5.2)
BUN: 13 mg/dL (ref 6–23)
CO2: 24 mEq/L (ref 19–32)
Calcium: 9.8 mg/dL (ref 8.4–10.5)
Chloride: 105 mEq/L (ref 96–112)
Creat: 0.81 mg/dL (ref 0.50–1.10)
Glucose, Bld: 99 mg/dL (ref 70–99)
Potassium: 4.8 mEq/L (ref 3.5–5.3)
Sodium: 140 mEq/L (ref 135–145)
Total Bilirubin: 0.5 mg/dL (ref 0.2–1.2)
Total Protein: 6.5 g/dL (ref 6.0–8.3)

## 2014-10-07 LAB — LIPID PANEL
CHOL/HDL RATIO: 2 ratio
Cholesterol: 173 mg/dL (ref 0–200)
HDL: 87 mg/dL (ref >39)
LDL CALC: 76 mg/dL (ref 0–99)
Triglycerides: 49 mg/dL (ref <150)
VLDL: 10 mg/dL (ref 0–40)

## 2014-10-07 LAB — TSH: TSH: 1.496 u[IU]/mL (ref 0.350–4.500)

## 2014-10-07 NOTE — Addendum Note (Signed)
Addended by: Thurnell Garbe A on: 10/07/2014 11:13 AM   Modules accepted: Orders, SmartSet

## 2014-10-07 NOTE — Patient Instructions (Addendum)
Tdap Vaccine (Tetanus, Diphtheria, Pertussis): What You Need to Know 1. Why get vaccinated? Tetanus, diphtheria and pertussis can be very serious diseases, even for adolescents and adults. Tdap vaccine can protect us from these diseases. TETANUS (Lockjaw) causes painful muscle tightening and stiffness, usually all over the body.  It can lead to tightening of muscles in the head and neck so you can't open your mouth, swallow, or sometimes even breathe. Tetanus kills about 1 out of 5 people who are infected. DIPHTHERIA can cause a thick coating to form in the back of the throat.  It can lead to breathing problems, paralysis, heart failure, and death. PERTUSSIS (Whooping Cough) causes severe coughing spells, which can cause difficulty breathing, vomiting and disturbed sleep.  It can also lead to weight loss, incontinence, and rib fractures. Up to 2 in 100 adolescents and 5 in 100 adults with pertussis are hospitalized or have complications, which could include pneumonia or death. These diseases are caused by bacteria. Diphtheria and pertussis are spread from person to person through coughing or sneezing. Tetanus enters the body through cuts, scratches, or wounds. Before vaccines, the [place] saw as many as 200,000 cases a year of diphtheria and pertussis, and hundreds of cases of tetanus. Since vaccination began, tetanus and diphtheria have dropped by about 99% and pertussis by about 80%. 2. Tdap vaccine Tdap vaccine can protect adolescents and adults from tetanus, diphtheria, and pertussis. One dose of Tdap is routinely given at age 11 or 12. People who did not get Tdap at that age should get it as soon as possible. Tdap is especially important for health care professionals and anyone having close contact with a baby younger than 12 months. Pregnant women should get a dose of Tdap during every pregnancy, to protect the newborn from pertussis. Infants are most at risk for severe, life-threatening  complications from pertussis. A similar vaccine, called Td, protects from tetanus and diphtheria, but not pertussis. A Td booster should be given every 10 years. Tdap may be given as one of these boosters if you have not already gotten a dose. Tdap may also be given after a severe cut or burn to prevent tetanus infection. Your doctor can give you more information. Tdap may safely be given at the same time as other vaccines. 3. Some people should not get this vaccine  If you ever had a life-threatening allergic reaction after a dose of any tetanus, diphtheria, or pertussis containing vaccine, OR if you have a severe allergy to any part of this vaccine, you should not get Tdap. Tell your doctor if you have any severe allergies.  If you had a coma, or long or multiple seizures within 7 days after a childhood dose of DTP or DTaP, you should not get Tdap, unless a cause other than the vaccine was found. You can still get Td.  Talk to your doctor if you:  have epilepsy or another nervous system problem,  had severe pain or swelling after any vaccine containing diphtheria, tetanus or pertussis,  ever had Guillain-Barr Syndrome (GBS),  aren't feeling well on the day the shot is scheduled. 4. Risks of a vaccine reaction With any medicine, including vaccines, there is a chance of side effects. These are usually mild and go away on their own, but serious reactions are also possible. Brief fainting spells can follow a vaccination, leading to injuries from falling. Sitting or lying down for about 15 minutes can help prevent these. Tell your doctor if you feel   dizzy or light-headed, or have vision changes or ringing in the ears. Mild problems following Tdap (Did not interfere with activities)  Pain where the shot was given (about 3 in 4 adolescents or 2 in 3 adults)  Redness or swelling where the shot was given (about 1 person in 5)  Mild fever of at least 100.74F (up to about 1 in 25 adolescents or  1 in 100 adults)  Headache (about 3 or 4 people in 10)  Tiredness (about 1 person in 3 or 4)  Nausea, vomiting, diarrhea, stomach ache (up to 1 in 4 adolescents or 1 in 10 adults)  Chills, body aches, sore joints, rash, swollen glands (uncommon) Moderate problems following Tdap (Interfered with activities, but did not require medical attention)  Pain where the shot was given (about 1 in 5 adolescents or 1 in 100 adults)  Redness or swelling where the shot was given (up to about 1 in 16 adolescents or 1 in 25 adults)  Fever over 102F (about 1 in 100 adolescents or 1 in 250 adults)  Headache (about 3 in 20 adolescents or 1 in 10 adults)  Nausea, vomiting, diarrhea, stomach ache (up to 1 or 3 people in 100)  Swelling of the entire arm where the shot was given (up to about 3 in 100). Severe problems following Tdap (Unable to perform usual activities; required medical attention)  Swelling, severe pain, bleeding and redness in the arm where the shot was given (rare). A severe allergic reaction could occur after any vaccine (estimated less than 1 in a million doses). 5. What if there is a serious reaction? What should I look for?  Look for anything that concerns you, such as signs of a severe allergic reaction, very high fever, or behavior changes. Signs of a severe allergic reaction can include hives, swelling of the face and throat, difficulty breathing, a fast heartbeat, dizziness, and weakness. These would start a few minutes to a few hours after the vaccination. What should I do?  If you think it is a severe allergic reaction or other emergency that can't wait, call 9-1-1 or get the person to the nearest hospital. Otherwise, call your doctor.  Afterward, the reaction should be reported to the "Vaccine Adverse Event Reporting System" (VAERS). Your doctor might file this report, or you can do it yourself through the VAERS web site at www.vaers.SamedayNews.es, or by calling  (334)881-9678. VAERS is only for reporting reactions. They do not give medical advice.  6. The National Vaccine Injury Compensation Program The [employer] Vaccine Injury Compensation Program (VICP) is a federal program that was created to compensate people who may have been injured by certain vaccines. Persons who believe they may have been injured by a vaccine can learn about the program and about filing a claim by calling 684-826-2798 or visiting the [place] website at GoldCloset.com.ee. 7. How can I learn more?  Ask your doctor.  Call your local or state health department.  Contact the Centers for Disease Control and Prevention (CDC):  Call 682-290-1518 or visit CDC's website at http://hunter.com/. CDC Tdap Vaccine VIS (04/05/12) Document Released: 05/15/2012 Document Revised: 03/31/2014 Document Reviewed: 02/26/2014 Exit[employer] Patient Information 2015 [place], [place]. This information is not intended to replace advice given to you by your health care provider. Make sure you discuss any questions you have with your health care provider.  Perimenopause Perimenopause is the time when your body begins to move into the menopause (no menstrual period for 12 straight months). It is a  natural process. Perimenopause can begin 2-8 years before the menopause and usually lasts for 1 year after the menopause. During this time, your ovaries may or may not produce an egg. The ovaries vary in their production of estrogen and progesterone hormones each month. This can cause irregular menstrual periods, difficulty getting pregnant, vaginal bleeding between periods, and uncomfortable symptoms. CAUSES  Irregular production of the ovarian hormones, estrogen and progesterone, and not ovulating every month.  Other causes include:  Tumor of the pituitary gland in the brain.  Medical disease that affects the ovaries.  Radiation treatment.  Chemotherapy.  Unknown causes.  Heavy smoking  and excessive alcohol intake can bring on perimenopause sooner. SIGNS AND SYMPTOMS   Hot flashes.  Night sweats.  Irregular menstrual periods.  Decreased sex drive.  Vaginal dryness.  Headaches.  Mood swings.  Depression.  Memory problems.  Irritability.  Tiredness.  Weight gain.  Trouble getting pregnant.  The beginning of losing bone cells (osteoporosis).  The beginning of hardening of the arteries (atherosclerosis). DIAGNOSIS  Your health care provider will make a diagnosis by analyzing your age, menstrual history, and symptoms. He or she will do a physical exam and note any changes in your body, especially your female organs. Female hormone tests may or may not be helpful depending on the amount of female hormones you produce and when you produce them. However, other hormone tests may be helpful to rule out other problems. TREATMENT  In some cases, no treatment is needed. The decision on whether treatment is necessary during the perimenopause should be made by you and your health care provider based on how the symptoms are affecting you and your lifestyle. Various treatments are available, such as:  Treating individual symptoms with a specific medicine for that symptom.  Herbal medicines that can help specific symptoms.  Counseling.  Group therapy. HOME CARE INSTRUCTIONS   Keep track of your menstrual periods (when they occur, how heavy they are, how long between periods, and how long they last) as well as your symptoms and when they started.  Only take over-the-counter or prescription medicines as directed by your health care provider.  Sleep and rest.  Exercise.  Eat a diet that contains calcium (good for your bones) and soy (acts like the estrogen hormone).  Do not smoke.  Avoid alcoholic beverages.  Take vitamin supplements as recommended by your health care provider. Taking vitamin E may help in certain cases.  Take calcium and vitamin D  supplements to help prevent bone loss.  Group therapy is sometimes helpful.  Acupuncture may help in some cases. SEEK MEDICAL CARE IF:   You have questions about any symptoms you are having.  You need a referral to a specialist (gynecologist, psychiatrist, or psychologist). SEEK IMMEDIATE MEDICAL CARE IF:   You have vaginal bleeding.  Your period lasts longer than 8 days.  Your periods are recurring sooner than 21 days.  You have bleeding after intercourse.  You have severe depression.  You have pain when you urinate.  You have severe headaches.  You have vision problems. Document Released: 12/22/2004 Document Revised: 09/04/2013 Document Reviewed: 06/13/2013 [facility] Patient Information 2015 [place], [place]. This information is not intended to replace advice given to you by your health care provider. Make sure you discuss any questions you have with your health care provider.

## 2014-10-07 NOTE — Progress Notes (Signed)
Madeline Rodgers 1971-02-25 [id number]   History:    43 y.o.  for annual gyn exam with complaints of intermenstrual spotting for several months. She states she has been spotting now since October. Her husband has had a vasectomy and they also use withdrawal for contraception. Patient is in a monogamous relationship.patient with no past history of abnormal Pap smears. Patient declined flu vaccine. Patient has not had the T Tdap vaccine.  Past medical history,surgical history, family history and social history were all reviewed and documented in the EPIC chart.  Gynecologic History Patient's last menstrual period was 08/31/2014. Contraception: vasectomy Last Pap: 2014. Results were: normal Last mammogram: 2015. Results were: normal but dense next year will need 3-dimensional mammogram  Obstetric History OB History  Gravida Para Term Preterm AB SAB TAB Ectopic Multiple Living  4 2 2  1 1    2     # Outcome Date GA Lbr Len/2nd Weight Sex Delivery Anes PTL Lv  4 SAB           3 Term     M Vag-Spont  N Y  2 Term     M Vag-Spont  N Y  1 Gravida                ROS: A ROS was performed and pertinent positives and negatives are included in the history.  GENERAL: No fevers or chills. HEENT: No change in vision, no earache, sore throat or sinus congestion. NECK: No pain or stiffness. CARDIOVASCULAR: No chest pain or pressure. No palpitations. PULMONARY: No shortness of breath, cough or wheeze. GASTROINTESTINAL: No abdominal pain, nausea, vomiting or diarrhea, melena or bright red blood per rectum. GENITOURINARY: No urinary frequency, urgency, hesitancy or dysuria. MUSCULOSKELETAL: No joint or muscle pain, no back pain, no recent trauma. DERMATOLOGIC: No rash, no itching, no lesions. ENDOCRINE: No polyuria, polydipsia, no heat or cold intolerance. No recent change in weight. HEMATOLOGICAL: No anemia or easy bruising or bleeding. NEUROLOGIC: No headache, seizures, numbness, tingling or weakness.  PSYCHIATRIC: No depression, no loss of interest in normal activity or change in sleep pattern.     Exam: chaperone present  BP 126/78 mmHg  Ht 5' 8.25" (1.734 m)  Wt 172 lb (78.019 kg)  BMI 25.95 kg/m2  LMP 08/31/2014  Body mass index is 25.95 kg/(m^2).  General appearance : Well developed well nourished female. No acute distress HEENT: Neck supple, trachea midline, no carotid bruits, no thyroidmegaly Lungs: Clear to auscultation, no rhonchi or wheezes, or rib retractions  Heart: Regular rate and rhythm, no murmurs or gallops Breast:Examined in sitting and supine position were symmetrical in appearance, no palpable masses or tenderness,  no skin retraction, no nipple inversion, no nipple discharge, no skin discoloration, no axillary or supraclavicular lymphadenopathy Abdomen: no palpable masses or tenderness, no rebound or guarding Extremities: no edema or skin discoloration or tenderness  Pelvic:  Bartholin, Urethra, Skene Glands: Within normal limits             Vagina: No gross lesions or discharge,dark brown blood noted  Cervix: No gross lesions or discharge  Uterus  retroverted, normal size, shape and consistency, non-tender and mobile  Adnexa  Without masses or tenderness  Anus and perineum  normal   Rectovaginal  normal sphincter tone without palpated masses or tenderness             Hemoccult not indicated   Patient was counseled for endometrial biopsy. The cervix was cleansed with Betadine solution.  A single-tooth tenaculum was placed on the anterior cervical lip. The cervix required some dilatation to facilitate insertion of the Pipelle. Endometrial biopsy was obtained. Tissue was submitted for histological evaluation.  Assessment/Plan:  43 y.o. female for annual exam intermenstrual spotting possibly early perimenopause? Patient with no vasomotor symptoms. We will check an [facility] today along with her CBC, comprehensive metabolic panel, TSH, fasting lipid profile and  urinalysis. A Pap smear was done today. Patient did receive the T Tdap vaccine today. Patient will return back to the office next week for sonohysterogram to rule out any intracavitary defects such as polyps or myoma contribute to the intermenstrual spotting. Next year patient will need to have a three-dimensional mammogram due to the fact that this years mammogram demonstrated that her breasts were dense.   Terrance Mass MD, 10:59 AM 10/07/2014

## 2014-10-07 NOTE — Addendum Note (Signed)
Addended by: Thurnell Garbe A on: 10/07/2014 11:58 AM   Modules accepted: Orders, SmartSet

## 2014-10-08 LAB — URINALYSIS W MICROSCOPIC + REFLEX CULTURE
BACTERIA UA: NONE SEEN
Bilirubin Urine: NEGATIVE
Casts: NONE SEEN
Crystals: NONE SEEN
Glucose, UA: NEGATIVE mg/dL
HGB URINE DIPSTICK: NEGATIVE
KETONES UR: NEGATIVE mg/dL
LEUKOCYTES UA: NEGATIVE
NITRITE: NEGATIVE
Protein, ur: NEGATIVE mg/dL
Specific Gravity, Urine: 1.011 (ref 1.005–1.030)
Squamous Epithelial / LPF: NONE SEEN
Urobilinogen, UA: 0.2 mg/dL (ref 0.0–1.0)
pH: 7 (ref 5.0–8.0)

## 2014-10-08 LAB — FOLLICLE STIMULATING HORMONE: FSH: 22.2 m[IU]/mL

## 2014-10-09 LAB — CYTOLOGY - PAP

## 2014-10-13 ENCOUNTER — Other Ambulatory Visit: Payer: Self-pay | Admitting: Gynecology

## 2014-10-13 DIAGNOSIS — N923 Ovulation bleeding: Secondary | ICD-10-CM

## 2014-10-16 ENCOUNTER — Ambulatory Visit: Payer: BC Managed Care – PPO | Admitting: Gynecology

## 2014-10-16 ENCOUNTER — Other Ambulatory Visit: Payer: BC Managed Care – PPO

## 2014-11-12 ENCOUNTER — Ambulatory Visit (INDEPENDENT_AMBULATORY_CARE_PROVIDER_SITE_OTHER): Payer: BC Managed Care – PPO | Admitting: Gynecology

## 2014-11-12 ENCOUNTER — Other Ambulatory Visit: Payer: Self-pay | Admitting: Gynecology

## 2014-11-12 ENCOUNTER — Other Ambulatory Visit: Payer: BC Managed Care – PPO

## 2014-11-12 ENCOUNTER — Ambulatory Visit: Payer: BC Managed Care – PPO | Admitting: Gynecology

## 2014-11-12 ENCOUNTER — Encounter: Payer: Self-pay | Admitting: Gynecology

## 2014-11-12 ENCOUNTER — Ambulatory Visit (INDEPENDENT_AMBULATORY_CARE_PROVIDER_SITE_OTHER): Payer: BC Managed Care – PPO

## 2014-11-12 DIAGNOSIS — D251 Intramural leiomyoma of uterus: Secondary | ICD-10-CM

## 2014-11-12 DIAGNOSIS — N920 Excessive and frequent menstruation with regular cycle: Secondary | ICD-10-CM

## 2014-11-12 DIAGNOSIS — N831 Corpus luteum cyst of ovary, unspecified side: Secondary | ICD-10-CM

## 2014-11-12 DIAGNOSIS — N923 Ovulation bleeding: Secondary | ICD-10-CM

## 2014-11-12 DIAGNOSIS — N939 Abnormal uterine and vaginal bleeding, unspecified: Secondary | ICD-10-CM

## 2014-11-12 NOTE — Progress Notes (Signed)
   Patient presented to the office today for sonohysterogram to the fact that when she was seen in the office on November 10 for her annual exam she had complained of at times having intermenstrual spotting. Her husband has had a vasectomy. She also uses withdrawal for contraception. Her recent endometrial biopsy, CBC, fasting lipid profile, compresses a metabolic panel, TSH and urinalysis as well as Pap smear were all normal.  Ultrasound/sono hysterogram: Uterus measured 8.8 x 6.1 x 5.27 m with endometrial stripe of 5.3 mm. 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 Right ovary had a corpus luteum cyst measuring 20 x 13 mm with positive color flow at the periphery. Left R was normal there was some fluid noted in the cul-de-sac. The cervix was then cleansed with Betadine solution and a sterile catheter was introduced into the uterine cavity. Normal saline was instilled no entry entering abnormality was noted with the exception of a slightly indentation of the posterior fundal region from a nearby myoma but was not in the cavity.  Assessment/plan: Patient with several episodes of intermenstrual spotting this past month she's had no bleeding. Normal sonohysterogram with the exception small intramural myoma one slightly encroaching but not in the endometrial cavity. We'll continue to wait and monitor she will maintain a menstrual calendar. If it continues for more than 6 months she'll return back to the office if not we will see her back in one year or when necessary.

## 2014-12-10 ENCOUNTER — Ambulatory Visit: Payer: BC Managed Care – PPO | Admitting: Gynecology

## 2014-12-10 ENCOUNTER — Other Ambulatory Visit: Payer: BC Managed Care – PPO

## 2015-10-07 ENCOUNTER — Other Ambulatory Visit: Payer: Self-pay

## 2015-10-07 DIAGNOSIS — Z1231 Encounter for screening mammogram for malignant neoplasm of breast: Secondary | ICD-10-CM

## 2015-10-08 ENCOUNTER — Ambulatory Visit
Admission: RE | Admit: 2015-10-08 | Discharge: 2015-10-08 | Disposition: A | Discharge status: Home / Self Care | Payer: BLUE CROSS/BLUE SHIELD | Source: Ambulatory Visit

## 2015-10-08 DIAGNOSIS — Z1231 Encounter for screening mammogram for malignant neoplasm of breast: Secondary | ICD-10-CM

## 2015-11-06 ENCOUNTER — Encounter: Payer: Self-pay | Admitting: Gynecology

## 2016-08-08 ENCOUNTER — Telehealth: Payer: Self-pay | Admitting: *Deleted

## 2016-08-08 NOTE — Telephone Encounter (Signed)
Pt called c/o lump on torso noticed last week, 2 weeks late on cycle, has pressure in this area at times off and on. History of fibroids, pt scheduled for annual on 08/16/16, I explained to pt she could come in to see nancy and have annual next week as well. Pt scheduled on 08/10/16 to see nancy.

## 2016-08-10 ENCOUNTER — Ambulatory Visit: Payer: Self-pay | Admitting: Women's Health

## 2016-08-10 ENCOUNTER — Ambulatory Visit (INDEPENDENT_AMBULATORY_CARE_PROVIDER_SITE_OTHER): Payer: BLUE CROSS/BLUE SHIELD | Admitting: Women's Health

## 2016-08-10 ENCOUNTER — Encounter: Payer: Self-pay | Admitting: Women's Health

## 2016-08-10 VITALS — BP 138/82 | Ht 68.0 in

## 2016-08-10 DIAGNOSIS — N912 Amenorrhea, unspecified: Secondary | ICD-10-CM | POA: Diagnosis not present

## 2016-08-10 LAB — PREGNANCY, URINE: Preg Test, Ur: NEGATIVE

## 2016-08-10 NOTE — Patient Instructions (Signed)
Perimenopause Perimenopause is the time when your body begins to move into the menopause (no menstrual period for 12 straight months). It is a natural process. Perimenopause can begin 2-8 years before the menopause and usually lasts for 1 year after the menopause. During this time, your ovaries may or may not produce an egg. The ovaries vary in their production of estrogen and progesterone hormones each month. This can cause irregular menstrual periods, difficulty getting pregnant, vaginal bleeding between periods, and uncomfortable symptoms. CAUSES  Irregular production of the ovarian hormones, estrogen and progesterone, and not ovulating every month.  Other causes include:  Tumor of the pituitary gland in the brain.  Medical disease that affects the ovaries.  Radiation treatment.  Chemotherapy.  Unknown causes.  Heavy smoking and excessive alcohol intake can bring on perimenopause sooner. SIGNS AND SYMPTOMS   Hot flashes.  Night sweats.  Irregular menstrual periods.  Decreased sex drive.  Vaginal dryness.  Headaches.  Mood swings.  Depression.  Memory problems.  Irritability.  Tiredness.  Weight gain.  Trouble getting pregnant.  The beginning of losing bone cells (osteoporosis).  The beginning of hardening of the arteries (atherosclerosis). DIAGNOSIS  Your health care provider will make a diagnosis by analyzing your age, menstrual history, and symptoms. He or she will do a physical exam and note any changes in your body, especially your female organs. Female hormone tests may or may not be helpful depending on the amount of female hormones you produce and when you produce them. However, other hormone tests may be helpful to rule out other problems. TREATMENT  In some cases, no treatment is needed. The decision on whether treatment is necessary during the perimenopause should be made by you and your health care provider based on how the symptoms are affecting you  and your lifestyle. Various treatments are available, such as:  Treating individual symptoms with a specific medicine for that symptom.  Herbal medicines that can help specific symptoms.  Counseling.  Group therapy. HOME CARE INSTRUCTIONS   Keep track of your menstrual periods (when they occur, how heavy they are, how long between periods, and how long they last) as well as your symptoms and when they started.  Only take over-the-counter or prescription medicines as directed by your health care provider.  Sleep and rest.  Exercise.  Eat a diet that contains calcium (good for your bones) and soy (acts like the estrogen hormone).  Do not smoke.  Avoid alcoholic beverages.  Take vitamin supplements as recommended by your health care provider. Taking vitamin E may help in certain cases.  Take calcium and vitamin D supplements to help prevent bone loss.  Group therapy is sometimes helpful.  Acupuncture may help in some cases. SEEK MEDICAL CARE IF:   You have questions about any symptoms you are having.  You need a referral to a specialist (gynecologist, psychiatrist, or psychologist). SEEK IMMEDIATE MEDICAL CARE IF:   You have vaginal bleeding.  Your period lasts longer than 8 days.  Your periods are recurring sooner than 21 days.  You have bleeding after intercourse.  You have severe depression.  You have pain when you urinate.  You have severe headaches.  You have vision problems.   This information is not intended to replace advice given to you by your health care provider. Make sure you discuss any questions you have with your health care provider.   Document Released: 12/22/2004 Document Revised: 12/05/2014 Document Reviewed: 06/13/2013 Elsevier Interactive Patient Education [employer].  Hormone Therapy At menopause, your body begins making less estrogen and progesterone hormones. This causes the body to stop having menstrual periods. This is because  estrogen and progesterone hormones control your periods and menstrual cycle. A lack of estrogen may cause symptoms such as:  Hot flushes (or hot flashes).  Vaginal dryness.  Dry skin.  Loss of sex drive.  Risk of bone loss (osteoporosis). When this happens, you may choose to take hormone therapy to get back the estrogen lost during menopause. When the hormone estrogen is given alone, it is usually referred to as ET (Estrogen Therapy). When the hormone progestin is combined with estrogen, it is generally called HT (Hormone Therapy). This was formerly known as hormone replacement therapy (HRT). Your caregiver can help you make a decision on what will be best for you. The decision to use HT seems to change often as new studies are done. Many studies do not agree on the benefits of hormone replacement therapy. LIKELY BENEFITS OF HT INCLUDE PROTECTION FROM:  Hot Flushes (also called hot flashes) - A hot flush is a sudden feeling of heat that spreads over the face and body. The skin may redden like a blush. It is connected with sweats and sleep disturbance. Women going through menopause may have hot flushes a few times a month or several times per day depending on the woman.  Osteoporosis (bone loss) - Estrogen helps guard against bone loss. After menopause, a woman's bones slowly lose calcium and become weak and brittle. As a result, bones are more likely to break. The hip, wrist, and spine are affected most often. Hormone therapy can help slow bone loss after menopause. Weight bearing exercise and taking calcium with vitamin D also can help prevent bone loss. There are also medications that your caregiver can prescribe that can help prevent osteoporosis.  Vaginal dryness - Loss of estrogen causes changes in the vagina. Its lining may become thin and dry. These changes can cause pain and bleeding during sexual intercourse. Dryness can also lead to infections. This can cause burning and itching. (Vaginal  estrogen treatment can help relieve pain, itching, and dryness.)  Urinary tract infections are more common after menopause because of lack of estrogen. Some women also develop urinary incontinence because of low estrogen levels in the vagina and bladder.  Possible other benefits of estrogen include a positive effect on mood and short-term memory in women. RISKS AND COMPLICATIONS  Using estrogen alone without progesterone causes the lining of the uterus to grow. This increases the risk of lining of the uterus (endometrial) cancer. Your caregiver should give another hormone called progestin if you have a uterus.  Women who take combined (estrogen and progestin) HT appear to have an increased risk of breast cancer. The risk appears to be small, but increases throughout the time that HT is taken.  Combined therapy also makes the breast tissue slightly denser which makes it harder to read mammograms (breast X-rays).  Combined, estrogen and progesterone therapy can be taken together every day, in which case there may be spotting of blood. HT therapy can be taken cyclically in which case you will have menstrual periods. Cyclically means HT is taken for a set amount of days, then not taken, then this process is repeated.  HT may increase the risk of stroke, heart attack, breast cancer and forming blood clots in your leg.  Transdermal estrogen (estrogen that is absorbed through the skin with a patch or a cream) may have better results  with:  Cholesterol.  Blood pressure.  Blood clots. Having the following conditions may indicate you should not have HT:  Endometrial cancer.  Liver disease.  Breast cancer.  Heart disease.  History of blood clots.  Stroke. TREATMENT   If you choose to take HT and have a uterus, usually estrogen and progestin are prescribed.  Your caregiver will help you decide the best way to take the medications.  Possible ways to take estrogen  include:  Pills.  Patches.  Gels.  Sprays.  Vaginal estrogen cream, rings and tablets.  It is best to take the lowest dose possible that will help your symptoms and take them for the shortest period of time that you can.  Hormone therapy can help relieve some of the problems (symptoms) that affect women at menopause. Before making a decision about HT, talk to your caregiver about what is best for you. Be well informed and comfortable with your decisions. HOME CARE INSTRUCTIONS   Follow your caregivers advice when taking the medications.  A Pap test is done to screen for cervical cancer.  The first Pap test should be done at age 76.  Between ages 76 and 83, Pap tests are repeated every 2 years.  Beginning at age 108, you are advised to have a Pap test every 3 years as long as the past 3 Pap tests have been normal.  Some women have medical problems that increase the chance of getting cervical cancer. Talk to your caregiver about these problems. It is especially important to talk to your caregiver if a new problem develops soon after your last Pap test. In these cases, your caregiver may recommend more frequent screening and Pap tests.  The above recommendations are the same for women who have or have not gotten the vaccine for HPV (human papillomavirus).  If you had a hysterectomy for a problem that was not a cancer or a condition that could lead to cancer, then you no longer need Pap tests. However, even if you no longer need a Pap test, a regular exam is a good idea to make sure no other problems are starting.  If you are between ages 31 and 65, and you have had normal Pap tests going back 10 years, you no longer need Pap tests. However, even if you no longer need a Pap test, a regular exam is a good idea to make sure no other problems are starting.  If you have had past treatment for cervical cancer or a condition that could lead to cancer, you need Pap tests and screening for  cancer for at least 20 years after your treatment.  If Pap tests have been discontinued, risk factors (such as a new sexual partner)need to be re-assessed to determine if screening should be resumed.  Some women may need screenings more often if they are at high risk for cervical cancer.  Get mammograms done as per the advice of your caregiver. SEEK IMMEDIATE MEDICAL CARE IF:  You develop abnormal vaginal bleeding.  You have pain or swelling in your legs, shortness of breath, or chest pain.  You develop dizziness or headaches.  You have lumps or changes in your breasts or armpits.  You have slurred speech.  You develop weakness or numbness of your arms or legs.  You have pain, burning, or bleeding when urinating.  You develop abdominal pain.   This information is not intended to replace advice given to you by your health care provider. Make sure you discuss  any questions you have with your health care provider.   Document Released: 08/13/2003 Document Revised: 03/31/2015 Document Reviewed: 05/18/2015 Elsevier Interactive Patient Education [employer].

## 2016-08-10 NOTE — Progress Notes (Signed)
Presents with complaints of irregular menstrual cycle and truncal rash. History of fibroids, 10/2014 U/S showing 2 small intramural myomas, largest measuring 19x16 mm, and right ovary with corpus luteum cyst measuring 20x13 mm. States cycles are normally  every month, with heavy menstrual flow for 3 days, 2 months ago with lighter flow than usual and this month she is 2 weeks late on her menstrual cycle. Husband vasectomy. Also complains of a rash on her trunk, raised and red, denies pruritis for 1 week. Endorses some perimenopausal symptoms such as hot flashes, vaginal dryness, and trouble sleeping. [place]  22.2 09/2014. Denies vaginal discharge or urinary symptoms. Denies change in routine, laundry products  or skin care.  Exam: Maculopapular raised, erythematous rash concentrated on trunk only. None noted on shoulders, arms or legs External genitalia within normal limits. Speculum exam vaginal walls and cervix normal and non-erythematous, mildly atrophic, scant white discharge.   Urine pregnancy negative  Perimenopausal symptoms  Truncal Rash  Plan: Discussed perimenopausal and menopausal symptoms with patient, including options for HRT in future if she feels it is necessary. Recommended OTC Benadryl for truncal rash. Annual visit next month with Dr. Toney Rakes, f/u earlier if symptoms have not improved. [place] pending.

## 2016-08-11 LAB — FOLLICLE STIMULATING HORMONE: FSH: 66.7 m[IU]/mL

## 2016-08-16 ENCOUNTER — Encounter: Payer: Self-pay | Admitting: Gynecology

## 2017-04-12 ENCOUNTER — Encounter: Payer: Self-pay | Admitting: Gynecology

## 2017-05-15 ENCOUNTER — Telehealth: Payer: Self-pay

## 2017-05-15 NOTE — Telephone Encounter (Signed)
Dr. Jarome Matin or Dr. Rolm Bookbinder had [facility] dermatology give her the number

## 2017-05-15 NOTE — Telephone Encounter (Signed)
Patient informed and phone number provided.

## 2017-05-15 NOTE — Telephone Encounter (Signed)
Has CE scheduled 05/25/17 with you.  Patient asked if you would refer her to a dermatologist. Has a spot on her should that "is like a bloody pimple". "Won't go away and keeps opening up."

## 2017-05-25 ENCOUNTER — Encounter: Payer: BLUE CROSS/BLUE SHIELD | Admitting: Gynecology

## 2017-12-08 ENCOUNTER — Other Ambulatory Visit: Payer: Self-pay | Admitting: Women's Health

## 2017-12-08 DIAGNOSIS — Z139 Encounter for screening, unspecified: Secondary | ICD-10-CM

## 2017-12-29 ENCOUNTER — Ambulatory Visit
Admission: RE | Admit: 2017-12-29 | Discharge: 2017-12-29 | Disposition: A | Discharge status: Home / Self Care | Payer: BLUE CROSS/BLUE SHIELD | Source: Ambulatory Visit | Attending: Women's Health | Admitting: Women's Health

## 2017-12-29 DIAGNOSIS — Z139 Encounter for screening, unspecified: Secondary | ICD-10-CM

## 2018-07-25 ENCOUNTER — Encounter: Payer: Self-pay | Admitting: Women's Health

## 2018-07-25 ENCOUNTER — Ambulatory Visit (INDEPENDENT_AMBULATORY_CARE_PROVIDER_SITE_OTHER): Payer: Managed Care, Other (non HMO) | Admitting: Women's Health

## 2018-07-25 VITALS — BP 140/80 | Ht 68.0 in | Wt 180.0 lb

## 2018-07-25 DIAGNOSIS — Z13220 Encounter for screening for lipoid disorders: Secondary | ICD-10-CM | POA: Diagnosis not present

## 2018-07-25 DIAGNOSIS — Z13820 Encounter for screening for osteoporosis: Secondary | ICD-10-CM

## 2018-07-25 DIAGNOSIS — Z01419 Encounter for gynecological examination (general) (routine) without abnormal findings: Secondary | ICD-10-CM

## 2018-07-25 LAB — COMPREHENSIVE METABOLIC PANEL
AG Ratio: 2 (calc) (ref 1.0–2.5)
ALT: 14 U/L (ref 6–29)
AST: 21 U/L (ref 10–35)
Albumin: 4.9 g/dL (ref 3.6–5.1)
Alkaline phosphatase (APISO): 56 U/L (ref 33–115)
BUN: 17 mg/dL (ref 7–25)
CO2: 26 mmol/L (ref 20–32)
CREATININE: 0.84 mg/dL (ref 0.50–1.10)
Calcium: 11.1 mg/dL — ABNORMAL HIGH (ref 8.6–10.2)
Chloride: 102 mmol/L (ref 98–110)
GLUCOSE: 86 mg/dL (ref 65–99)
Globulin: 2.4 g/dL (calc) (ref 1.9–3.7)
Potassium: 5 mmol/L (ref 3.5–5.3)
SODIUM: 139 mmol/L (ref 135–146)
TOTAL PROTEIN: 7.3 g/dL (ref 6.1–8.1)
Total Bilirubin: 0.7 mg/dL (ref 0.2–1.2)

## 2018-07-25 LAB — CBC WITH DIFFERENTIAL/PLATELET
BASOS ABS: 39 {cells}/uL (ref 0–200)
Basophils Relative: 0.7 %
EOS PCT: 2 %
Eosinophils Absolute: 110 cells/uL (ref 15–500)
HCT: 41.9 % (ref 35.0–45.0)
Hemoglobin: 14 g/dL (ref 11.7–15.5)
Lymphs Abs: 1579 cells/uL (ref 850–3900)
MCH: 30.7 pg (ref 27.0–33.0)
MCHC: 33.4 g/dL (ref 32.0–36.0)
MCV: 91.9 fL (ref 80.0–100.0)
MPV: 10.8 fL (ref 7.5–12.5)
Monocytes Relative: 8.7 %
NEUTROS PCT: 59.9 %
Neutro Abs: 3295 cells/uL (ref 1500–7800)
Platelets: 291 10*3/uL (ref 140–400)
RBC: 4.56 10*6/uL (ref 3.80–5.10)
RDW: 12.8 % (ref 11.0–15.0)
Total Lymphocyte: 28.7 %
WBC mixed population: 479 cells/uL (ref 200–950)
WBC: 5.5 10*3/uL (ref 3.8–10.8)

## 2018-07-25 LAB — LIPID PANEL
Cholesterol: 232 mg/dL — ABNORMAL HIGH (ref <200)
HDL: 126 mg/dL (ref >50)
LDL Cholesterol (Calc): 93 mg/dL (calc)
Non-HDL Cholesterol (Calc): 106 mg/dL (calc) (ref <130)
TRIGLYCERIDES: 50 mg/dL (ref <150)
Total CHOL/HDL Ratio: 1.8 (calc) (ref <5.0)

## 2018-07-25 NOTE — Progress Notes (Signed)
Madeline Rodgers [birth year] [id number]    History:    Presents for annual exam.  Postmenopausal on no HRT with no bleeding x2 years.  Normal Pap and mammogram history.  Last annual 2015.  States has had no health problems since.  Has had an annual mammogram.  Past medical history, past surgical history, family history and social history were all reviewed and documented in the EPIC chart.  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   2 sons both doing well, oldest son Madeline Rodgers 19 history of CLL at age 54 and 73.  Father 62 retired Psychologist, sport and exercise doing okay, mother 64 healthy.  6 siblings all healthy.  ROS:  A ROS was performed and pertinent positives and negatives are included.  Exam:  Vitals:   07/25/18 1137  BP: 140/80  Weight: 180 lb (81.6 kg)  Height: 5\' 8"  (1.727 m)   Body mass index is 27.37 kg/m.   General appearance:  Normal Thyroid:  Symmetrical, normal in size, without palpable masses or nodularity. Respiratory  Auscultation:  Clear without wheezing or rhonchi Cardiovascular  Auscultation:  Regular rate, without rubs, murmurs or gallops  Edema/varicosities:  Not grossly evident Abdominal  Soft,nontender, without masses, guarding or rebound.  Liver/spleen:  No organomegaly noted  Hernia:  None appreciated  Skin  Inspection:  Grossly normal   Breasts: Examined lying and sitting.     Right: Without masses, retractions, discharge or axillary adenopathy.     Left: Without masses, retractions, discharge or axillary adenopathy. Gentitourinary   Inguinal/mons:  Normal without inguinal adenopathy  External genitalia:  Normal  BUS/Urethra/Skene's glands:  Normal  Vagina:  Normal  Cervix:  Normal  Uterus:   normal in size, shape and contour.  Midline and mobile  Adnexa/parametria:     Rt: Without masses or tenderness.   Lt: Without masses or tenderness.  Anus and perineum: Normal  Digital rectal exam: Normal sphincter tone without palpated masses or tenderness  Assessment/Plan:  47 y.o. MWF G4, P2 for  annual exam with no complaints.  Postmenopausal/no HRT/no bleeding  Plan: DEXA, will schedule.  Reviewed importance of continuing regular exercise, plays tennis, calcium rich foods, vitamin D 2000 daily encouraged.  SBE's, continue annual screening mammogram, screening colonoscopy at 50.  Encouraged annual exam also.  CBC, CMP, lipid panel, Pap with HR HPV typing, new screening guidelines reviewed.   [place], 12:23 PM 07/25/2018

## 2018-07-25 NOTE — Addendum Note (Signed)
Addended by: Lorine Bears on: 07/25/2018 12:41 PM   Modules accepted: Orders

## 2018-07-25 NOTE — Patient Instructions (Signed)
Health Maintenance for Postmenopausal Women Menopause is a normal process in which your reproductive ability comes to an end. This process happens gradually over a span of months to years, usually between the ages of 22 and 9. Menopause is complete when you have missed 12 consecutive menstrual periods. It is important to talk with your health care provider about some of the most common conditions that affect postmenopausal women, such as heart disease, cancer, and bone loss (osteoporosis). Adopting a healthy lifestyle and getting preventive care can help to promote your health and wellness. Those actions can also lower your chances of developing some of these common conditions. What should I know about menopause? During menopause, you may experience a number of symptoms, such as:  Moderate-to-severe hot flashes.  Night sweats.  Decrease in sex drive.  Mood swings.  Headaches.  Tiredness.  Irritability.  Memory problems.  Insomnia.  Choosing to treat or not to treat menopausal changes is an individual decision that you make with your health care provider. What should I know about hormone replacement therapy and supplements? Hormone therapy products are effective for treating symptoms that are associated with menopause, such as hot flashes and night sweats. Hormone replacement carries certain risks, especially as you become older. If you are thinking about using estrogen or estrogen with progestin treatments, discuss the benefits and risks with your health care provider. What should I know about heart disease and stroke? Heart disease, heart attack, and stroke become more likely as you age. This may be due, in part, to the hormonal changes that your body experiences during menopause. These can affect how your body processes dietary fats, triglycerides, and cholesterol. Heart attack and stroke are both medical emergencies. There are many things that you can do to help prevent heart disease  and stroke:  Have your blood pressure checked at least every 1-2 years. High blood pressure causes heart disease and increases the risk of stroke.  If you are 53-22 years old, ask your health care provider if you should take aspirin to prevent a heart attack or a stroke.  Do not use any tobacco products, including cigarettes, chewing tobacco, or electronic cigarettes. If you need help quitting, ask your health care provider.  It is important to eat a healthy diet and maintain a healthy weight. ? Be sure to include plenty of vegetables, fruits, low-fat dairy products, and lean protein. ? Avoid eating foods that are high in solid fats, added sugars, or salt (sodium).  Get regular exercise. This is one of the most important things that you can do for your health. ? Try to exercise for at least 150 minutes each week. The type of exercise that you do should increase your heart rate and make you sweat. This is known as moderate-intensity exercise. ? Try to do strengthening exercises at least twice each week. Do these in addition to the moderate-intensity exercise.  Know your numbers.Ask your health care provider to check your cholesterol and your blood glucose. Continue to have your blood tested as directed by your health care provider.  What should I know about cancer screening? There are several types of cancer. Take the following steps to reduce your risk and to catch any cancer development as early as possible. Breast Cancer  Practice breast self-awareness. ? This means understanding how your breasts normally appear and feel. ? It also means doing regular breast self-exams. Let your health care provider know about any changes, no matter how small.  If you are 40  or older, have a clinician do a breast exam (clinical breast exam or CBE) every year. Depending on your age, family history, and medical history, it may be recommended that you also have a yearly breast X-ray (mammogram).  If you  have a family history of breast cancer, talk with your health care provider about genetic screening.  If you are at high risk for breast cancer, talk with your health care provider about having an MRI and a mammogram every year.  Breast cancer (BRCA) gene test is recommended for women who have family members with BRCA-related cancers. Results of the assessment will determine the need for genetic counseling and BRCA1 and for BRCA2 testing. BRCA-related cancers include these types: ? Breast. This occurs in males or females. ? Ovarian. ? Tubal. This may also be called fallopian tube cancer. ? Cancer of the abdominal or pelvic lining (peritoneal cancer). ? Prostate. ? Pancreatic.  Cervical, Uterine, and Ovarian Cancer Your health care provider may recommend that you be screened regularly for cancer of the pelvic organs. These include your ovaries, uterus, and vagina. This screening involves a pelvic exam, which includes checking for microscopic changes to the surface of your cervix (Pap test).  For women ages 21-65, health care providers may recommend a pelvic exam and a Pap test every three years. For women ages 79-65, they may recommend the Pap test and pelvic exam, combined with testing for human papilloma virus (HPV), every five years. Some types of HPV increase your risk of cervical cancer. Testing for HPV may also be done on women of any age who have unclear Pap test results.  Other health care providers may not recommend any screening for nonpregnant women who are considered low risk for pelvic cancer and have no symptoms. Ask your health care provider if a screening pelvic exam is right for you.  If you have had past treatment for cervical cancer or a condition that could lead to cancer, you need Pap tests and screening for cancer for at least 20 years after your treatment. If Pap tests have been discontinued for you, your risk factors (such as having a new sexual partner) need to be  reassessed to determine if you should start having screenings again. Some women have medical problems that increase the chance of getting cervical cancer. In these cases, your health care provider may recommend that you have screening and Pap tests more often.  If you have a family history of uterine cancer or ovarian cancer, talk with your health care provider about genetic screening.  If you have vaginal bleeding after reaching menopause, tell your health care provider.  There are currently no reliable tests available to screen for ovarian cancer.  Lung Cancer Lung cancer screening is recommended for adults 69-62 years old who are at high risk for lung cancer because of a history of smoking. A yearly low-dose CT scan of the lungs is recommended if you:  Currently smoke.  Have a history of at least 30 pack-years of smoking and you currently smoke or have quit within the past 15 years. A pack-year is smoking an average of one pack of cigarettes per day for one year.  Yearly screening should:  Continue until it has been 15 years since you quit.  Stop if you develop a health problem that would prevent you from having lung cancer treatment.  Colorectal Cancer  This type of cancer can be detected and can often be prevented.  Routine colorectal cancer screening usually begins at  age 42 and continues through age 45.  If you have risk factors for colon cancer, your health care provider may recommend that you be screened at an earlier age.  If you have a family history of colorectal cancer, talk with your health care provider about genetic screening.  Your health care provider may also recommend using home test kits to check for hidden blood in your stool.  A small camera at the end of a tube can be used to examine your colon directly (sigmoidoscopy or colonoscopy). This is done to check for the earliest forms of colorectal cancer.  Direct examination of the colon should be repeated every  5-10 years until age 71. However, if early forms of precancerous polyps or small growths are found or if you have a family history or genetic risk for colorectal cancer, you may need to be screened more often.  Skin Cancer  Check your skin from head to toe regularly.  Monitor any moles. Be sure to tell your health care provider: ? About any new moles or changes in moles, especially if there is a change in a mole's shape or color. ? If you have a mole that is larger than the size of a pencil eraser.  If any of your family members has a history of skin cancer, especially at a young age, talk with your health care provider about genetic screening.  Always use sunscreen. Apply sunscreen liberally and repeatedly throughout the day.  Whenever you are outside, protect yourself by wearing long sleeves, pants, a wide-brimmed hat, and sunglasses.  What should I know about osteoporosis? Osteoporosis is a condition in which bone destruction happens more quickly than new bone creation. After menopause, you may be at an increased risk for osteoporosis. To help prevent osteoporosis or the bone fractures that can happen because of osteoporosis, the following is recommended:  If you are 46-71 years old, get at least 1,000 mg of calcium and at least 600 mg of vitamin D per day.  If you are older than age 55 but younger than age 65, get at least 1,200 mg of calcium and at least 600 mg of vitamin D per day.  If you are older than age 54, get at least 1,200 mg of calcium and at least 800 mg of vitamin D per day.  Smoking and excessive alcohol intake increase the risk of osteoporosis. Eat foods that are rich in calcium and vitamin D, and do weight-bearing exercises several times each week as directed by your health care provider. What should I know about how menopause affects my mental health? Depression may occur at any age, but it is more common as you become older. Common symptoms of depression  include:  Low or sad mood.  Changes in sleep patterns.  Changes in appetite or eating patterns.  Feeling an overall lack of motivation or enjoyment of activities that you previously enjoyed.  Frequent crying spells.  Talk with your health care provider if you think that you are experiencing depression. What should I know about immunizations? It is important that you get and maintain your immunizations. These include:  Tetanus, diphtheria, and pertussis (Tdap) booster vaccine.  Influenza every year before the flu season begins.  Pneumonia vaccine.  Shingles vaccine.  Your health care provider may also recommend other immunizations. This information is not intended to replace advice given to you by your health care provider. Make sure you discuss any questions you have with your health care provider. Document Released: 01/06/2006  Document Revised: 06/03/2016 Document Reviewed: 08/18/2015 Elsevier Interactive Patient Education  2018 [employer]  

## 2018-07-26 LAB — URINALYSIS, COMPLETE W/RFL CULTURE
BILIRUBIN URINE: NEGATIVE
Bacteria, UA: NONE SEEN /HPF
Glucose, UA: NEGATIVE
Hgb urine dipstick: NEGATIVE
Hyaline Cast: NONE SEEN /LPF
Ketones, ur: NEGATIVE
Leukocyte Esterase: NEGATIVE
NITRITES URINE, INITIAL: NEGATIVE
Protein, ur: NEGATIVE
RBC / HPF: NONE SEEN /HPF (ref 0–2)
Specific Gravity, Urine: 1.019 (ref 1.001–1.03)
Squamous Epithelial / LPF: NONE SEEN /HPF (ref <=5)
WBC, UA: NONE SEEN /HPF (ref 0–5)
pH: 7.5 (ref 5.0–8.0)

## 2018-07-26 LAB — NO CULTURE INDICATED

## 2018-07-27 LAB — PAP, TP IMAGING W/ HPV RNA, RFLX HPV TYPE 16,18/45: HPV DNA High Risk: NOT DETECTED

## 2018-08-13 ENCOUNTER — Telehealth: Payer: Self-pay | Admitting: *Deleted

## 2018-08-13 NOTE — Telephone Encounter (Signed)
Patient with normal pap results on 07/25/18 visit.

## 2019-05-14 ENCOUNTER — Other Ambulatory Visit: Payer: Self-pay | Admitting: Women's Health

## 2019-05-14 DIAGNOSIS — Z1231 Encounter for screening mammogram for malignant neoplasm of breast: Secondary | ICD-10-CM

## 2019-05-17 ENCOUNTER — Telehealth: Payer: Self-pay | Admitting: *Deleted

## 2019-05-17 NOTE — Telephone Encounter (Signed)
Yes Fluconazole 150 mg 1 tab PO daily x 3.  #3 refill x 1

## 2019-05-17 NOTE — Telephone Encounter (Signed)
Madeline Rodgers patient) patient has used [employer] for yeast infection, and no relief asked if diflucan tablet can be sent to pharmacy, if no improve will schedule OV. Please advise

## 2019-05-20 MED ORDER — FLUCONAZOLE 150 MG PO TABS: 150.0000 mg | ORAL_TABLET | Freq: Every day | ORAL | 1 refills | Status: DC

## 2019-05-20 NOTE — Telephone Encounter (Signed)
Rx sent, left on voicemail Rx sent

## 2019-06-06 ENCOUNTER — Ambulatory Visit
Admission: RE | Admit: 2019-06-06 | Discharge: 2019-06-06 | Disposition: A | Discharge status: Home / Self Care | Payer: Managed Care, Other (non HMO) | Source: Ambulatory Visit | Attending: Women's Health | Admitting: Women's Health

## 2019-06-06 ENCOUNTER — Other Ambulatory Visit: Payer: Self-pay

## 2019-06-06 DIAGNOSIS — Z1231 Encounter for screening mammogram for malignant neoplasm of breast: Secondary | ICD-10-CM

## 2019-08-27 ENCOUNTER — Encounter: Payer: Managed Care, Other (non HMO) | Admitting: Women's Health

## 2019-09-16 ENCOUNTER — Other Ambulatory Visit: Payer: Self-pay

## 2019-09-17 ENCOUNTER — Encounter: Payer: Self-pay | Admitting: Women's Health

## 2019-09-17 ENCOUNTER — Ambulatory Visit (INDEPENDENT_AMBULATORY_CARE_PROVIDER_SITE_OTHER): Payer: Managed Care, Other (non HMO) | Admitting: Women's Health

## 2019-09-17 VITALS — BP 118/78 | Ht 68.0 in | Wt 179.0 lb

## 2019-09-17 DIAGNOSIS — Z13220 Encounter for screening for lipoid disorders: Secondary | ICD-10-CM | POA: Diagnosis not present

## 2019-09-17 DIAGNOSIS — Z01419 Encounter for gynecological examination (general) (routine) without abnormal findings: Secondary | ICD-10-CM

## 2019-09-17 NOTE — Progress Notes (Signed)
KLA MCCLARREN [birth year] [id number]    History:    Presents for annual exam.  Postmenopausal x2 years no HRT with no bleeding.  Vaginal dryness relieved with over-the-counter [place].  Normal Pap and mammogram history.  Has not had a screening colonoscopy.  History of multiple small fibroids asymptomatic.  Past medical history, past surgical history, family history and social history were all reviewed and documented in the EPIC chart.  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  Mother 40 doing well father healthy until age 71, tumor and is now on numerous medications.  2 sons ages 95 and 86, 43 year old had CLL at age 30 and 32 doing well student at [employer].  ROS:  A ROS was performed and pertinent positives and negatives are included.  Exam:  Vitals:   09/17/19 1205  BP: 118/78  Weight: 179 lb (81.2 kg)  Height: 5\' 8"  (1.727 m)   Body mass index is 27.22 kg/m.   General appearance:  Normal Thyroid:  Symmetrical, normal in size, without palpable masses or nodularity. Respiratory  Auscultation:  Clear without wheezing or rhonchi Cardiovascular  Auscultation:  Regular rate, without rubs, murmurs or gallops  Edema/varicosities:  Not grossly evident Abdominal  Soft,nontender, without masses, guarding or rebound.  Liver/spleen:  No organomegaly noted  Hernia:  None appreciated  Skin  Inspection:  Grossly normal   Breasts: Examined lying and sitting.     Right: Without masses, retractions, discharge or axillary adenopathy.     Left: Without masses, retractions, discharge or axillary adenopathy. Gentitourinary   Inguinal/mons:  Normal without inguinal adenopathy  External genitalia:  Normal  BUS/Urethra/Skene's glands:  Normal  Vagina:  Normal  Cervix:  Normal  Uterus:  normal in size, shape and contour.  Midline and mobile  Adnexa/parametria:     Rt: Without masses or tenderness.   Lt: Without masses or tenderness.  Anus and perineum: Normal  Digital rectal exam: Normal sphincter tone without  palpated masses or tenderness  Assessment/Plan:  48 y.o. MWF G4 P2 for annual exam with no complaints.  Postmenopausal on no HRT with no bleeding  Plan: Options for vaginal dryness discussed, will continue with [place].  SBEs, continue annual screening mammogram, calcium rich foods, vitamin D 2000 daily encouraged.  Continue active lifestyle of regular exercise, tennis.  Screening colonoscopy reviewed, [facility] information given instructed to schedule.  CBC, CMP, lipid panel, Pap normal 2019, new screening guidelines reviewed.  Plans to get the flu shot.    [place], 12:30 PM 09/17/2019

## 2019-09-17 NOTE — Patient Instructions (Addendum)
[facility]  Dr Carlean Purl [phone]  Vit D3 2000 daily    Health Maintenance for Postmenopausal Women Menopause is a normal process in which your ability to get pregnant comes to an end. This process happens slowly over many months or years, usually between the ages of 80 and 32. Menopause is complete when you have missed your menstrual periods for 12 months. It is important to talk with your health care provider about some of the most common conditions that affect women after menopause (postmenopausal women). These include heart disease, cancer, and bone loss (osteoporosis). Adopting a healthy lifestyle and getting preventive care can help to promote your health and wellness. The actions you take can also lower your chances of developing some of these common conditions. What should I know about menopause? During menopause, you may get a number of symptoms, such as:  Hot flashes. These can be moderate or severe.  Night sweats.  Decrease in sex drive.  Mood swings.  Headaches.  Tiredness.  Irritability.  Memory problems.  Insomnia. Choosing to treat or not to treat these symptoms is a decision that you make with your health care provider. Do I need hormone replacement therapy?  Hormone replacement therapy is effective in treating symptoms that are caused by menopause, such as hot flashes and night sweats.  Hormone replacement carries certain risks, especially as you become older. If you are thinking about using estrogen or estrogen with progestin, discuss the benefits and risks with your health care provider. What is my risk for heart disease and stroke? The risk of heart disease, heart attack, and stroke increases as you age. One of the causes may be a change in the body's hormones during menopause. This can affect how your body uses dietary fats, triglycerides, and cholesterol. Heart attack and stroke are medical emergencies. There are many things that you can do to help prevent  heart disease and stroke. Watch your blood pressure  High blood pressure causes heart disease and increases the risk of stroke. This is more likely to develop in people who have high blood pressure readings, are of African descent, or are overweight.  Have your blood pressure checked: ? Every 3-5 years if you are 68-83 years of age. ? Every year if you are 14 years old or older. Eat a healthy diet   Eat a diet that includes plenty of vegetables, fruits, low-fat dairy products, and lean protein.  Do not eat a lot of foods that are high in solid fats, added sugars, or sodium. Get regular exercise Get regular exercise. This is one of the most important things you can do for your health. Most adults should:  Try to exercise for at least 150 minutes each week. The exercise should increase your heart rate and make you sweat (moderate-intensity exercise).  Try to do strengthening exercises at least twice each week. Do these in addition to the moderate-intensity exercise.  Spend less time sitting. Even light physical activity can be beneficial. Other tips  Work with your health care provider to achieve or maintain a healthy weight.  Do not use any products that contain nicotine or tobacco, such as cigarettes, e-cigarettes, and chewing tobacco. If you need help quitting, ask your health care provider.  Know your numbers. Ask your health care provider to check your cholesterol and your blood sugar (glucose). Continue to have your blood tested as directed by your health care provider. Do I need screening for cancer? Depending on your health history and family history,  you may need to have cancer screening at different stages of your life. This may include screening for:  Breast cancer.  Cervical cancer.  Lung cancer.  Colorectal cancer. What is my risk for osteoporosis? After menopause, you may be at increased risk for osteoporosis. Osteoporosis is a condition in which bone destruction  happens more quickly than new bone creation. To help prevent osteoporosis or the bone fractures that can happen because of osteoporosis, you may take the following actions:  If you are 67-45 years old, get at least 1,000 mg of calcium and at least 600 mg of vitamin D per day.  If you are older than age 27 but younger than age 56, get at least 1,200 mg of calcium and at least 600 mg of vitamin D per day.  If you are older than age 66, get at least 1,200 mg of calcium and at least 800 mg of vitamin D per day. Smoking and drinking excessive alcohol increase the risk of osteoporosis. Eat foods that are rich in calcium and vitamin D, and do weight-bearing exercises several times each week as directed by your health care provider. How does menopause affect my mental health? Depression may occur at any age, but it is more common as you become older. Common symptoms of depression include:  Low or sad mood.  Changes in sleep patterns.  Changes in appetite or eating patterns.  Feeling an overall lack of motivation or enjoyment of activities that you previously enjoyed.  Frequent crying spells. Talk with your health care provider if you think that you are experiencing depression. General instructions See your health care provider for regular wellness exams and vaccines. This may include:  Scheduling regular health, dental, and eye exams.  Getting and maintaining your vaccines. These include: ? Influenza vaccine. Get this vaccine each year before the flu season begins. ? Pneumonia vaccine. ? Shingles vaccine. ? Tetanus, diphtheria, and pertussis (Tdap) booster vaccine. Your health care provider may also recommend other immunizations. Tell your health care provider if you have ever been abused or do not feel safe at home. Summary  Menopause is a normal process in which your ability to get pregnant comes to an end.  This condition causes hot flashes, night sweats, decreased interest in sex,  mood swings, headaches, or lack of sleep.  Treatment for this condition may include hormone replacement therapy.  Take actions to keep yourself healthy, including exercising regularly, eating a healthy diet, watching your weight, and checking your blood pressure and blood sugar levels.  Get screened for cancer and depression. Make sure that you are up to date with all your vaccines. This information is not intended to replace advice given to you by your health care provider. Make sure you discuss any questions you have with your health care provider. Document Released: 01/06/2006 Document Revised: 11/07/2018 Document Reviewed: 11/07/2018 Elsevier Patient Education  2020 [employer].

## 2019-09-18 LAB — COMPREHENSIVE METABOLIC PANEL
AG Ratio: 2.3 (calc) (ref 1.0–2.5)
ALT: 10 U/L (ref 6–29)
AST: 16 U/L (ref 10–35)
Albumin: 4.5 g/dL (ref 3.6–5.1)
Alkaline phosphatase (APISO): 67 U/L (ref 37–153)
BUN: 20 mg/dL (ref 7–25)
CO2: 27 mmol/L (ref 20–32)
Calcium: 10.9 mg/dL — ABNORMAL HIGH (ref 8.6–10.4)
Chloride: 105 mmol/L (ref 98–110)
Creat: 0.76 mg/dL (ref 0.50–1.05)
Globulin: 2 g/dL (calc) (ref 1.9–3.7)
Glucose, Bld: 100 mg/dL — ABNORMAL HIGH (ref 65–99)
Potassium: 5.1 mmol/L (ref 3.5–5.3)
Sodium: 141 mmol/L (ref 135–146)
Total Bilirubin: 0.6 mg/dL (ref 0.2–1.2)
Total Protein: 6.5 g/dL (ref 6.1–8.1)

## 2019-09-18 LAB — URINALYSIS, COMPLETE W/RFL CULTURE
Bacteria, UA: NONE SEEN /HPF
Bilirubin Urine: NEGATIVE
Glucose, UA: NEGATIVE
Hgb urine dipstick: NEGATIVE
Hyaline Cast: NONE SEEN /LPF
Ketones, ur: NEGATIVE
Leukocyte Esterase: NEGATIVE
Nitrites, Initial: NEGATIVE
Protein, ur: NEGATIVE
Specific Gravity, Urine: 1.028 (ref 1.001–1.03)
Squamous Epithelial / LPF: NONE SEEN /HPF (ref <=5)
WBC, UA: NONE SEEN /HPF (ref 0–5)
pH: 6 (ref 5.0–8.0)

## 2019-09-18 LAB — CBC WITH DIFFERENTIAL/PLATELET
Absolute Monocytes: 486 cells/uL (ref 200–950)
Basophils Absolute: 38 cells/uL (ref 0–200)
Basophils Relative: 0.7 %
Eosinophils Absolute: 81 cells/uL (ref 15–500)
Eosinophils Relative: 1.5 %
HCT: 39.1 % (ref 35.0–45.0)
Hemoglobin: 12.9 g/dL (ref 11.7–15.5)
Lymphs Abs: 1539 cells/uL (ref 850–3900)
MCH: 30.1 pg (ref 27.0–33.0)
MCHC: 33 g/dL (ref 32.0–36.0)
MCV: 91.4 fL (ref 80.0–100.0)
MPV: 11.2 fL (ref 7.5–12.5)
Monocytes Relative: 9 %
Neutro Abs: 3256 cells/uL (ref 1500–7800)
Neutrophils Relative %: 60.3 %
Platelets: 265 10*3/uL (ref 140–400)
RBC: 4.28 10*6/uL (ref 3.80–5.10)
RDW: 12.4 % (ref 11.0–15.0)
Total Lymphocyte: 28.5 %
WBC: 5.4 10*3/uL (ref 3.8–10.8)

## 2019-09-18 LAB — LIPID PANEL
Cholesterol: 227 mg/dL — ABNORMAL HIGH (ref <200)
HDL: 83 mg/dL (ref >=50)
LDL Cholesterol (Calc): 130 mg/dL (calc) — ABNORMAL HIGH
Non-HDL Cholesterol (Calc): 144 mg/dL (calc) — ABNORMAL HIGH (ref <130)
Total CHOL/HDL Ratio: 2.7 (calc) (ref <5.0)
Triglycerides: 52 mg/dL (ref <150)

## 2019-09-18 LAB — CULTURE INDICATED

## 2019-12-25 ENCOUNTER — Ambulatory Visit: Payer: Managed Care, Other (non HMO) | Attending: Internal Medicine

## 2019-12-25 DIAGNOSIS — Z20822 Contact with and (suspected) exposure to covid-19: Secondary | ICD-10-CM

## 2019-12-26 LAB — NOVEL CORONAVIRUS, NAA: SARS-CoV-2, NAA: NOT DETECTED

## 2020-03-27 ENCOUNTER — Telehealth: Payer: Self-pay | Admitting: *Deleted

## 2020-03-27 NOTE — Telephone Encounter (Signed)
Patient called and left message c/o bladder issues. Patient said she noticed 2 days ago, not able to hold her urine. Patient said yesterday she was doing yard work and noticed color discharge, no flack pain, no urinary urgency or pain with urination. I advised her OV best and we could see her next week, aware Izora Gala has retired. No appointment's today due to ML schedule. Patient decided to stop by urgent care and will follow up here if needed.

## 2020-04-02 ENCOUNTER — Other Ambulatory Visit: Payer: Self-pay

## 2020-04-03 ENCOUNTER — Ambulatory Visit: Payer: Managed Care, Other (non HMO) | Admitting: Obstetrics and Gynecology

## 2020-04-03 ENCOUNTER — Encounter: Payer: Self-pay | Admitting: Obstetrics and Gynecology

## 2020-04-03 VITALS — BP 118/74

## 2020-04-03 DIAGNOSIS — R32 Unspecified urinary incontinence: Secondary | ICD-10-CM | POA: Diagnosis not present

## 2020-04-03 DIAGNOSIS — N898 Other specified noninflammatory disorders of vagina: Secondary | ICD-10-CM | POA: Diagnosis not present

## 2020-04-03 NOTE — Progress Notes (Signed)
   Madeline Rodgers 07/27/1971 [id number]  SUBJECTIVE:  49 y.o. [id number] female presents for possible vaginal spotting. She also has noted some bladder incontinence for one day only just the other week.  She said she was pretty active that day, at work, wearing jeans which she has not worn in a while.  While bending over she noticed some bladder leakage.  She also noticed a little bit of discharge that looked like a pinkish mucous with some little bit of red, kind of like at the end of a period.  No other symptoms or further vaginal bleeding since that day.  She went to urgent care and had a negative UA without blood detected per her report.  LMP was 4-5 years ago.  Not taking any hormones.  No other bleeding.  Occasional mild uterine cramps.   Current Outpatient Medications  Medication Sig Dispense Refill  . Multiple Vitamin (MULTIVITAMIN) capsule Take 1 capsule by mouth daily.     No current facility-administered medications for this visit.   Allergies: Other  Patient's last menstrual period was 06/03/2019.  Past medical history,surgical history, problem list, medications, allergies, family history and social history were all reviewed and documented as reviewed in the EPIC chart.  ROS:  Feeling well. No dyspnea or chest pain on exertion.  No abdominal pain, change in bowel habits, black or bloody stools.  No urinary tract symptoms (only a transient one-time episode of bladder incontinence). GYN ROS:  See HPI   OBJECTIVE:  BP 118/74   LMP 06/03/2019  The patient appears well, alert, oriented x 3, in no distress. PELVIC EXAM: VULVA: normal appearing vulva with no masses, tenderness or lesions, atrophic changes noted, VAGINA: normal appearing vagina with normal color and discharge, no lesions, CERVIX: normal appearing cervix without discharge or lesions, UTERUS: uterus is normal size, shape, consistency and nontender, ADNEXA: normal adnexa in size, nontender and no masses  Chaperone: Caryn Bee present during the examination  ASSESSMENT:  49 y.o. [id number] here for self-limited episode of bladder incontinence and red-tinged vaginal discharge  PLAN:  No sign of any bleeding on exam today.  Completely normal vaginal exam with the exception of postmenopausal atrophy signs.  We discussed that given the very self-limited episode of red-tinged discharge, we do not necessarily need to commence a full work-up for endometrial abnormalities at this point, but I cautioned her that if she is having any ongoing bleeding or red-tinged discharge we will need to go this route, and she agrees with this plan.  In regard to the bladder incontinence, we discussed stress incontinence and urge incontinence.  I encouraged her to practice timed voids to keep her bladder empty.  She reports a negative urinalysis at another facility where she went to urgent care.  I encouraged her to practice Kegel exercises at least 3 times per day instructing her how to do that, will help with bladder control.  Will let us know if symptoms continue.  All questions were answered by the end of the visit.   Joseph Pierini MD 04/03/20

## 2020-05-06 ENCOUNTER — Other Ambulatory Visit: Payer: Self-pay | Admitting: Women's Health

## 2020-09-22 ENCOUNTER — Encounter: Payer: Self-pay | Admitting: Obstetrics and Gynecology

## 2020-09-22 ENCOUNTER — Other Ambulatory Visit: Payer: Self-pay

## 2020-09-22 ENCOUNTER — Ambulatory Visit: Payer: Managed Care, Other (non HMO) | Admitting: Obstetrics and Gynecology

## 2020-09-22 VITALS — BP 116/74 | Ht 68.5 in | Wt 181.0 lb

## 2020-09-22 DIAGNOSIS — Z13220 Encounter for screening for lipoid disorders: Secondary | ICD-10-CM | POA: Diagnosis not present

## 2020-09-22 DIAGNOSIS — R4586 Emotional lability: Secondary | ICD-10-CM | POA: Diagnosis not present

## 2020-09-22 DIAGNOSIS — N951 Menopausal and female climacteric states: Secondary | ICD-10-CM

## 2020-09-22 DIAGNOSIS — Z01419 Encounter for gynecological examination (general) (routine) without abnormal findings: Secondary | ICD-10-CM

## 2020-09-22 NOTE — Progress Notes (Signed)
   Madeline Rodgers 03/09/1971 [id number]  SUBJECTIVE:  49 y.o. [id number] female for annual routine gynecologic exam. Seen earlier this year with concern of possible vaginal spotting but this has resolved.  No further vaginal bleeding.  She does feel that she has been having difficulty with mood adjustment and depression symptoms in addition to mild hot flashes.  No previous history of depression in general he is a happy person so she feels it is due to the hormonal changes of menopause.  Current Outpatient Medications  Medication Sig Dispense Refill  . Multiple Vitamin (MULTIVITAMIN) capsule Take 1 capsule by mouth daily.     No current facility-administered medications for this visit.   Allergies: Other  Patient's last menstrual period was 06/03/2019.  Past medical history,surgical history, problem list, medications, allergies, family history and social history were all reviewed and documented as reviewed in the EPIC chart.  ROS: Pertinent positives and negatives as reviewed in HPI   OBJECTIVE:  BP 116/74   Ht 5' 8.5" (1.74 m)   Wt 181 lb (82.1 kg)   LMP 06/03/2019   BMI 27.12 kg/m  The patient appears well, alert, oriented, in no distress.  Tearful at times during the discussion today but appropriate range of emotion. ENT normal.  Neck supple. No cervical or supraclavicular adenopathy or thyromegaly.  Lungs are clear, good air entry, no wheezes, rhonchi or rales. S1 and S2 normal, no murmurs, regular rate and rhythm.  Abdomen soft without tenderness, guarding, mass or organomegaly.  Neurological is normal, no focal findings.  BREAST EXAM: breasts appear normal, no suspicious masses, no skin or nipple changes or axillary nodes  PELVIC EXAM: VULVA: normal appearing vulva with no masses, tenderness or lesions, VAGINA: normal appearing vagina with normal color and discharge, no lesions, CERVIX: normal appearing cervix without discharge or lesions, UTERUS: uterus is normal size, shape,  consistency and nontender, ADNEXA: normal adnexa in size, nontender and no masses  Chaperone: Caryn Bee present during the examination  ASSESSMENT:  49 y.o. [id number] here for annual gynecologic exam  PLAN:   1. Postmenopausal.  Mild hot flashes/vasomotor symptoms.  No vaginal bleeding.  Emotional and mood change concerns as above.  No history of depression.  Will check TSH.  We also discussed HRT.  Discussed patch versus pill formulation.  Discussed need for progestin to avoid overstimulation of endometrium.  She is okay with doing estradiol tablets, will start at 0.5 mg daily, in addition to Prometrium 100 mg nightly.  Discussed importance of using both, risks of HRT to include thrombotic diseases such as heart attack, stroke, DVT, PE, also the breast cancer and endometrial cancer issues.  Understanding that she does want to start HRT and she will notify us if there is any abnormal bleeding development or if her dose is not helping her sufficiently with symptoms after the first few months of use. 2. Pap smear/HPV 06/2018. Next Pap smear due 2024 following the current guidelines recommending the 5 year interval. 3. Mammogram 05/2019.  Normal breast exam today.  She is reminded to schedule an annual mammogram as this is overdue. 4. Colonoscopy never.  She says she does have a Cologuard test that she is planning to submit a sample for through her insurance. 5. DEXA never.  Plan further into menopause. 6. Health maintenance.  She will proceed to lab today for routine screening blood work (lipids, CBC, CMP) and TSH as mentioned above.  Return annually or sooner, prn.  Joseph Pierini MD 09/22/20

## 2020-09-23 ENCOUNTER — Telehealth: Payer: Self-pay | Admitting: *Deleted

## 2020-09-23 LAB — COMPREHENSIVE METABOLIC PANEL
AG Ratio: 2.3 (calc) (ref 1.0–2.5)
ALT: 16 U/L (ref 6–29)
AST: 18 U/L (ref 10–35)
Albumin: 4.6 g/dL (ref 3.6–5.1)
Alkaline phosphatase (APISO): 52 U/L (ref 37–153)
BUN: 16 mg/dL (ref 7–25)
CO2: 27 mmol/L (ref 20–32)
Calcium: 11.1 mg/dL — ABNORMAL HIGH (ref 8.6–10.4)
Chloride: 104 mmol/L (ref 98–110)
Creat: 0.86 mg/dL (ref 0.50–1.05)
Globulin: 2 g/dL (calc) (ref 1.9–3.7)
Glucose, Bld: 94 mg/dL (ref 65–99)
Potassium: 4.4 mmol/L (ref 3.5–5.3)
Sodium: 140 mmol/L (ref 135–146)
Total Bilirubin: 0.7 mg/dL (ref 0.2–1.2)
Total Protein: 6.6 g/dL (ref 6.1–8.1)

## 2020-09-23 LAB — CBC
HCT: 41.6 % (ref 35.0–45.0)
Hemoglobin: 14 g/dL (ref 11.7–15.5)
MCH: 31.4 pg (ref 27.0–33.0)
MCHC: 33.7 g/dL (ref 32.0–36.0)
MCV: 93.3 fL (ref 80.0–100.0)
MPV: 10.9 fL (ref 7.5–12.5)
Platelets: 269 10*3/uL (ref 140–400)
RBC: 4.46 10*6/uL (ref 3.80–5.10)
RDW: 12.1 % (ref 11.0–15.0)
WBC: 4.7 10*3/uL (ref 3.8–10.8)

## 2020-09-23 LAB — LIPID PANEL
Cholesterol: 224 mg/dL — ABNORMAL HIGH (ref <200)
HDL: 102 mg/dL (ref >=50)
LDL Cholesterol (Calc): 108 mg/dL (calc) — ABNORMAL HIGH
Non-HDL Cholesterol (Calc): 122 mg/dL (calc) (ref <130)
Total CHOL/HDL Ratio: 2.2 (calc) (ref <5.0)
Triglycerides: 58 mg/dL (ref <150)

## 2020-09-23 LAB — TSH: TSH: 1.29 mIU/L

## 2020-09-23 MED ORDER — PROGESTERONE MICRONIZED 100 MG PO CAPS: 100.0000 mg | ORAL_CAPSULE | Freq: Every day | ORAL | 3 refills | Status: DC

## 2020-09-23 MED ORDER — ESTRADIOL 0.5 MG PO TABS: 0.5000 mg | ORAL_TABLET | Freq: Every day | ORAL | 3 refills | Status: DC

## 2020-09-23 NOTE — Telephone Encounter (Signed)
Patient was seen yesterday and Rx's was not sent to pharmacy. Per note "She is okay with doing estradiol tablets, will start at 0.5 mg daily, in addition to Prometrium 100 mg nightly. Patient informed Rx sent.

## 2020-10-30 ENCOUNTER — Other Ambulatory Visit: Payer: Self-pay | Admitting: Obstetrics and Gynecology

## 2020-10-30 DIAGNOSIS — Z1231 Encounter for screening mammogram for malignant neoplasm of breast: Secondary | ICD-10-CM

## 2020-11-10 ENCOUNTER — Telehealth: Payer: Self-pay

## 2020-11-10 NOTE — Telephone Encounter (Signed)
Spoke with patient and advised

## 2020-11-10 NOTE — Telephone Encounter (Signed)
Patient started estrogen and progesterone a few months ago. She said last night she had cramping like going to start a period and minimal spotting today.  Was told to report if any spotting/bleeding.

## 2020-11-10 NOTE — Telephone Encounter (Signed)
If just a minimal and self-limited spotting and the cramping is not overly bothering her then I would just have her keep an eye on for the next few months and if it continues to happen we may need to either do an ultrasound or adjust her HRT doses.  Please reinforce that she does need to continue to be vigilant and report any ongoing bleeding beyond the next few months.

## 2020-11-12 ENCOUNTER — Telehealth: Payer: Self-pay | Admitting: *Deleted

## 2020-11-12 MED ORDER — PROGESTERONE 200 MG PO CAPS: 200.0000 mg | ORAL_CAPSULE | Freq: Every day | ORAL | 3 refills | Status: DC

## 2020-11-12 NOTE — Telephone Encounter (Signed)
She just started HRT 6 wks ago so at this point I would suggest increasing prometrium to 200 mg nightly. She is on a low estradiol dose so really can't go any lower without cutting tablet in half, but I would recommend first increasing the prometrium Is the HRT helping her at all?

## 2020-11-12 NOTE — Telephone Encounter (Signed)
Patient informed with below note, Rx sent for new dose of progesterone 200 mg capsule. Patient said HRT is helping with symptoms and she feel much better.

## 2020-11-12 NOTE — Telephone Encounter (Signed)
Patient called to follow up from telephone encounter on 11/10/20, called today reports bright bleeding has now turned into a flow, wearing a tampon and having cramping, bleeding is not heavy, nor is cramping painful. Patient asked do you recommend she still watch for now? Or ultrasound? Or did you want to adjust her HRT? Reports she just wanted to know  since now a "flow" if anything should be done different. Please advise

## 2020-12-11 ENCOUNTER — Ambulatory Visit: Payer: Managed Care, Other (non HMO)

## 2020-12-17 ENCOUNTER — Telehealth: Payer: Self-pay | Admitting: *Deleted

## 2020-12-17 ENCOUNTER — Other Ambulatory Visit: Payer: Self-pay

## 2020-12-17 ENCOUNTER — Ambulatory Visit
Admission: RE | Admit: 2020-12-17 | Discharge: 2020-12-17 | Disposition: A | Discharge status: Home / Self Care | Payer: Managed Care, Other (non HMO) | Source: Ambulatory Visit | Attending: Obstetrics and Gynecology | Admitting: Obstetrics and Gynecology

## 2020-12-17 DIAGNOSIS — Z1231 Encounter for screening mammogram for malignant neoplasm of breast: Secondary | ICD-10-CM

## 2020-12-17 NOTE — Telephone Encounter (Signed)
Patient called stating she missed 1 night of progesterone 200 mg capsule and now bleeding, 3 days of bleeding, not heavy only needing to change a pad twice daily. She started on this new dose of progesterone 200 mg back in Dec. 2021 and had no bleeding until now, previously she was on progesterone 100 mg capsule and dose was increased due to bleeding. Patient said you mentioned to her ultrasound if this continues. She wasn't sure if this was the next step. Please advise

## 2020-12-17 NOTE — Telephone Encounter (Signed)
Patient informed with below note. 

## 2020-12-17 NOTE — Telephone Encounter (Signed)
If the bleeding started because she missed dose of progesterone that would probably be the cause so I do not think an ultrasound is needed yet,  unless she were to continue bleeding and it does not stop within the next 1 to 2 weeks after continued regular nightly use of the progesterone as prescribed If the bleeding does not stop then let us know

## 2021-01-04 ENCOUNTER — Telehealth: Payer: Self-pay | Admitting: *Deleted

## 2021-01-04 DIAGNOSIS — N950 Postmenopausal bleeding: Secondary | ICD-10-CM

## 2021-01-04 NOTE — Telephone Encounter (Signed)
Patient called to follow up from telephone encounter on 12/17/20 patient reports she is still is having vaginal bleeding and was told to call if this should occur. Patient said she has not missed any of the estradiol or progesterone pill this time. Reports she is now having a dull pain under her belly button with movement and bending. Patient would like to have ultrasound. Please advise

## 2021-01-05 NOTE — Telephone Encounter (Signed)
yes

## 2021-01-05 NOTE — Telephone Encounter (Signed)
Patient scheduled on 01/19/21

## 2021-01-05 NOTE — Telephone Encounter (Signed)
Order placed, will route to [facility] to schedule and check benefits.

## 2021-01-07 ENCOUNTER — Ambulatory Visit: Payer: Managed Care, Other (non HMO) | Admitting: Obstetrics and Gynecology

## 2021-01-07 ENCOUNTER — Other Ambulatory Visit: Payer: Managed Care, Other (non HMO)

## 2021-01-19 ENCOUNTER — Ambulatory Visit: Payer: Managed Care, Other (non HMO) | Admitting: Obstetrics and Gynecology

## 2021-01-19 ENCOUNTER — Encounter: Payer: Self-pay | Admitting: Obstetrics and Gynecology

## 2021-01-19 ENCOUNTER — Other Ambulatory Visit: Payer: Self-pay

## 2021-01-19 ENCOUNTER — Ambulatory Visit (INDEPENDENT_AMBULATORY_CARE_PROVIDER_SITE_OTHER): Payer: Managed Care, Other (non HMO)

## 2021-01-19 VITALS — BP 122/80

## 2021-01-19 DIAGNOSIS — R9389 Abnormal findings on diagnostic imaging of other specified body structures: Secondary | ICD-10-CM | POA: Diagnosis not present

## 2021-01-19 DIAGNOSIS — N950 Postmenopausal bleeding: Secondary | ICD-10-CM | POA: Diagnosis not present

## 2021-01-19 DIAGNOSIS — Z79890 Hormone replacement therapy (postmenopausal): Secondary | ICD-10-CM | POA: Diagnosis not present

## 2021-01-19 MED ORDER — ESTRADIOL 0.5 MG PO TABS: 0.2500 mg | ORAL_TABLET | Freq: Every day | ORAL | 4 refills | Status: DC

## 2021-01-19 MED ORDER — PROGESTERONE 200 MG PO CAPS: 200.0000 mg | ORAL_CAPSULE | Freq: Every day | ORAL | 4 refills | Status: DC

## 2021-01-19 NOTE — Progress Notes (Signed)
Madeline Rodgers May 17, 1971 [id number]  SUBJECTIVE:  50 y.o. [id number] female presents for a pelvic ultrasound to work-up postmenopausal bleeding.  She had newly started HRT 09/2020 at a dose of 0.5 mg estradiol p.o. daily and Prometrium 100 mg daily.  She indicates she has been taking both hormones together at night.  She started to experience some light spotting and then started to have more of a light flow.  We increased Prometrium to 200 mg and she stopped having bleeding for a few weeks, and she then forgot to take 1 tablet and the bleeding started up again.  She started to have some uterine cramping.  We had discussed the possibility of doing a pelvic ultrasound and she wanted to move forward with this.  Still does occasionally have some light bleeding and other times just spotting.  She does feel the HRT is greatly helped her mood and general sense of emotional wellbeing.   Current Outpatient Medications  Medication Sig Dispense Refill  . Multiple Vitamin (MULTIVITAMIN) capsule Take 1 capsule by mouth daily.    Marland Kitchen estradiol (ESTRACE) 0.5 MG tablet Take 0.5 tablets (0.25 mg total) by mouth daily. 45 tablet 4  . progesterone (PROMETRIUM) 200 MG capsule Take 1 capsule (200 mg total) by mouth at bedtime. 90 capsule 4   No current facility-administered medications for this visit.   Allergies: Other  Patient's last menstrual period was 06/03/2019.  Past medical history,surgical history, problem list, medications, allergies, family history and social history were all reviewed and documented as reviewed in the EPIC chart.  ROS: Pertinent positives and negatives as reviewed in HPI    OBJECTIVE:  BP 122/80 (BP Location: Right Arm, Patient Position: Sitting, Cuff Size: Normal)   LMP 06/03/2019  The patient appears well, alert, oriented, in no distress.  Pelvic ultrasound Uterus 7.2 x 4.3 x 5.0 cm Several intramural fibroids, largest measuring 2.7 x 2.0 cm at the fundal area. Endometrium  4.4 mm thickness. Bilateral ovaries normal. No adnexal masses.  No free fluid pelvis.  ASSESSMENT:  50 y.o. [id number] here for postmenopausal bleeding newly started on HRT, marginal thickening of endometrium, known fibroids  PLAN:  We discussed the finding of the marginal thickening of the endometrium.  She is on a low-dose of HRT, and just started this in the last few months.  We could go ahead with an endometrial biopsy now (which I did offer her), but since she just started on HRT recently and then started having the bleeding, I think it would be okay to first lower the estrogen dose and see if this fixes the problem.  I instructed her to split the tablets in half so she effectively will be only getting 0.25 mg of estradiol daily and I told her to start taking this in the morning.  She will continue on Prometrium 200 mg nightly.  We discussed the role of estrogen and progesterone and proliferation and mediation of the endometrial lining.  Discussed that thickening of the endometrium in general raises suspicion for hyperplasia but since she just started the HRT recently at a low dose, and the thickening is just marginally increased over the 4 mm threshold, I think this is probably less likely to be the case at this point.  Discussed healthy diet and regular physical activity, association of increased BMI with abnormal endometrial tissue proliferation I gave her a new prescription for estradiol 0.5 mg tablets instructing her to take half tablet daily, with a 1 year supply.  I also  gave her a new prescription for Prometrium 200 mg tablets nightly. I want her to keep [place] updated in the next few months if she is continuing to have bleeding then I told her that we would likely want to proceed with hysteroscopy D&C. We discussed the finding of the fibroids which were present in the past.  Appear slightly larger than on the last imaging done about 5 years ago in 2017, but they are still overall pretty small.   Given intramural location these are probably less likely to be contributing to the bleeding.  Otherwise she has been asymptomatic in regard to the fibroids. The patient is aware that I am only at this practice through next week and we will plan to have her follow-up with one of my partners.   Joseph Pierini MD 01/19/21

## 2021-03-04 ENCOUNTER — Telehealth: Payer: Self-pay

## 2021-03-04 ENCOUNTER — Encounter: Payer: Self-pay | Admitting: Gastroenterology

## 2021-03-04 NOTE — Telephone Encounter (Signed)
Patient called to see who is recommended for her to see for screening colonoscopy. I provided her with phone # and name of [facility].

## 2021-04-12 ENCOUNTER — Encounter: Payer: Self-pay | Admitting: Gastroenterology

## 2021-04-12 ENCOUNTER — Ambulatory Visit (INDEPENDENT_AMBULATORY_CARE_PROVIDER_SITE_OTHER): Payer: Managed Care, Other (non HMO) | Admitting: Gastroenterology

## 2021-04-12 VITALS — BP 120/90 | HR 88 | Ht 68.0 in | Wt 176.2 lb

## 2021-04-12 DIAGNOSIS — Z1211 Encounter for screening for malignant neoplasm of colon: Secondary | ICD-10-CM | POA: Diagnosis not present

## 2021-04-12 MED ORDER — NA SULFATE-K SULFATE-MG SULF 17.5-3.13-1.6 GM/177ML PO SOLN: 1.0000 | Freq: Once | ORAL | 0 refills | Status: AC

## 2021-04-12 NOTE — Progress Notes (Signed)
HPI: This is a very pleasant 50 year old woman who was referred to me by Joseph Pierini, MD  to evaluate periumbilical pain, routine risk for colon cancer.    Starting around the time she began hormone replacement therapy which was about 6 months ago she noticed right sided periumbilical discomfort that occurred when bending over or when twisting to reach back in her car.  This went on for several weeks, even a few months eventually discussed with her gynecologist who performed a pelvic ultrasound, see that below.  The gynecologist did not feel that her fibroids were an explanation for her positional abdominal pains.  The pains were clearly positional.  They were not related to eating or moving her bowels.  She does not tend to have constipation or diarrhea.  She has no overt GI bleeding.  Colon cancer does not run in his family.  She intentionally lost about 10 pounds in the past several months because she felt she was getting "too fat"  Old Data Reviewed: Pelvic ultrasound transvaginal, February 2022 showed "several intramural fibroids, the largest measuring 2.7 x 2.0 cm in the fundal area"  Blood work October 2021 shows normal CBC normal complete metabolic profile except for calcium 11.1, normal TSH  Review of systems: Pertinent positive and negative review of systems were noted in the above HPI section. All other review negative.   Past Medical History:  Diagnosis Date  . Miscarriage   . Normal spontaneous vaginal delivery    2    Past Surgical History:  Procedure Laterality Date  . CHOLECYSTECTOMY  2000  . DILATION AND CURETTAGE OF UTERUS  1990  . TONSILLECTOMY  1977    Current Outpatient Medications  Medication Sig Dispense Refill  . estradiol (ESTRACE) 0.5 MG tablet Take 0.5 tablets (0.25 mg total) by mouth daily. 45 tablet 4  . Multiple Vitamin (MULTIVITAMIN) capsule Take 1 capsule by mouth as needed.    . progesterone (PROMETRIUM) 200 MG capsule Take 1 capsule  (200 mg total) by mouth at bedtime. 90 capsule 4   No current facility-administered medications for this visit.    Allergies as of 04/12/2021 - Review Complete 04/12/2021  Allergen Reaction Noted  . Other Rash 11/12/2014    Family History  Problem Relation Age of Onset  . Diabetes Maternal Grandmother   . Leukemia Son        ALL  . Lung cancer Maternal Uncle        (SMOKER)  . Stomach cancer Paternal Grandmother   . Hypertension Brother     Social History   Socioeconomic History  . Marital status: Married    Spouse name: Not on file  . Number of children: 2  . Years of education: Not on file  . Highest education level: Not on file  Occupational History  . Occupation: Health and safety inspector  Tobacco Use  . Smoking status: Former Smoker    Types: Cigarettes    Quit date: 1990    Years since quitting: 32.3  . Smokeless tobacco: Never Used  Vaping Use  . Vaping Use: Never used  Substance and Sexual Activity  . Alcohol use: Yes    Alcohol/week: 5.0 standard drinks    Types: 5 Standard drinks or equivalent per week    Comment: 2 per day  . Drug use: Not Currently  . Sexual activity: Yes    Birth control/protection: Post-menopausal    Comment: 1st intercourse 50 yo-More than 5 partners  Other Topics Concern  . Not on  file  Social History Narrative  . Not on file   Social Determinants of Health   Financial Resource Strain: Not on file  Food Insecurity: Not on file  Transportation Needs: Not on file  Physical Activity: Not on file  Stress: Not on file  Social Connections: Not on file  Intimate Partner Violence: Not on file     Physical Exam: BP 120/90 (BP Location: Left Arm, Patient Position: Sitting, Cuff Size: Normal)   Pulse 88   Ht 5\' 8"  (1.727 m) Comment: height measured without shoes  Wt 176 lb 4 oz (79.9 kg)   LMP 06/03/2019   BMI 26.80 kg/m  Constitutional: generally well-appearing Psychiatric: alert and oriented x3 Eyes: extraocular movements  intact Mouth: oral pharynx moist, no lesions Neck: supple no lymphadenopathy Cardiovascular: heart regular rate and rhythm Lungs: clear to auscultation bilaterally Abdomen: soft, nontender, nondistended, no obvious ascites, no peritoneal signs, normal bowel sounds Extremities: no lower extremity edema bilaterally Skin: no lesions on visible extremities   Assessment and plan: 50 y.o. female with resolved positional right lower quadrant abdominal pain, routine risk for colon cancer  First she is at routine risk for colon cancer I recommended a colonoscopy at her soonest convenience.  Second her right lower quadrant abdominal pains were very positional and not at all related to eating or moving her bowels.  I explained that these were unlikely related to her GI tract.  They have completely resolved.  I recommended that if the pains return and become persistent again that the next step in evaluation would be a CT scan abdomen and pelvis.  She completely agreed.  Please see the "Patient Instructions" section for addition details about the plan.   Owens Loffler, MD [facility] 04/12/2021, 8:40 AM  Cc: Joseph Pierini, MD  Total time on date of encounter was 45  minutes (this included time spent preparing to see the patient reviewing records; obtaining and/or reviewing separately obtained history; performing a medically appropriate exam and/or evaluation; counseling and educating the patient and family if present; ordering medications, tests or procedures if applicable; and documenting clinical information in the health record).

## 2021-04-12 NOTE — Patient Instructions (Signed)
If you are age 52 or younger, your body mass index should be between 19-25. Your Body mass index is 26.8 kg/m. If this is out of the aformentioned range listed, please consider follow up with your Primary Care Provider.   You have been scheduled for a colonoscopy. Please follow written instructions given to you at your visit today.  Please pick up your prep supplies at the pharmacy within the next 1-3 days. If you use inhalers (even only as needed), please bring them with you on the day of your procedure.  If you start to have the Periumbilical pains again call the office and speak with Dr. Ardis Hughs' nurse, we will get you set up for a CT at that time.  Follow up pending the results of your Colonoscopy or as needed.  Thank you for entrusting me with your care and choosing [facility].  Dr Ardis Hughs

## 2021-04-30 ENCOUNTER — Encounter: Payer: Managed Care, Other (non HMO) | Admitting: Gastroenterology

## 2021-05-28 ENCOUNTER — Other Ambulatory Visit: Payer: Self-pay

## 2021-05-28 ENCOUNTER — Telehealth: Payer: Self-pay | Admitting: Gastroenterology

## 2021-05-28 DIAGNOSIS — R1033 Periumbilical pain: Secondary | ICD-10-CM

## 2021-05-28 NOTE — Telephone Encounter (Signed)
Attempted to return phone call but received message that mailbox is full and cannot accept messages at this time. Will try again.  Pt states she is having the recurrent abd pain near her belly button. States Dr. Ardis Hughs told her to call the office if she had the pain again and he would order an xray. Please advise.

## 2021-05-28 NOTE — Telephone Encounter (Signed)
She needs CT scan abd/pelvis with IV and oral contrast for lwoer abd pains. thanks

## 2021-05-28 NOTE — Telephone Encounter (Signed)
Pt called stating that abd pain is happening again. She stated that Dr. Ardis Hughs told her that  if it happened again to call the office to do an imaging. Pls call her.

## 2021-05-28 NOTE — Telephone Encounter (Signed)
Order entered, pt will get call from rad scheduling to set CT of A/P up.

## 2021-06-03 ENCOUNTER — Other Ambulatory Visit: Payer: Self-pay

## 2021-06-03 ENCOUNTER — Ambulatory Visit (INDEPENDENT_AMBULATORY_CARE_PROVIDER_SITE_OTHER)
Admission: RE | Admit: 2021-06-03 | Discharge: 2021-06-03 | Disposition: A | Discharge status: Home / Self Care | Payer: Managed Care, Other (non HMO) | Source: Ambulatory Visit | Attending: Gastroenterology | Admitting: Gastroenterology

## 2021-06-03 DIAGNOSIS — R1033 Periumbilical pain: Secondary | ICD-10-CM

## 2021-06-03 MED ORDER — IOHEXOL 300 MG/ML  SOLN
100.0000 mL | Freq: Once | INTRAMUSCULAR | Status: AC | PRN
  Administered 2021-06-03: 100 mL via INTRAVENOUS

## 2021-06-04 ENCOUNTER — Telehealth: Payer: Self-pay | Admitting: Gastroenterology

## 2021-06-04 ENCOUNTER — Ambulatory Visit (HOSPITAL_COMMUNITY): Payer: Managed Care, Other (non HMO)

## 2021-06-04 ENCOUNTER — Other Ambulatory Visit: Payer: Managed Care, Other (non HMO)

## 2021-06-04 ENCOUNTER — Other Ambulatory Visit (HOSPITAL_COMMUNITY): Payer: Managed Care, Other (non HMO)

## 2021-06-04 NOTE — Telephone Encounter (Signed)
Dr Ardis Hughs the pt called for results of her CT scan from yesterday she is very anxious and asked me to pass that on to you so she can get some answers before the weekend.

## 2021-06-04 NOTE — Telephone Encounter (Signed)
See results note.  thanks

## 2021-06-04 NOTE — Telephone Encounter (Signed)
Inbound call from patient inquiring about CT results.

## 2021-06-09 ENCOUNTER — Encounter: Payer: Managed Care, Other (non HMO) | Admitting: Gastroenterology

## 2021-06-16 ENCOUNTER — Other Ambulatory Visit: Payer: Self-pay

## 2021-06-16 MED ORDER — ESTRADIOL 0.5 MG PO TABS: 0.2500 mg | ORAL_TABLET | Freq: Every day | ORAL | 0 refills | Status: DC

## 2021-06-16 MED ORDER — PROGESTERONE 200 MG PO CAPS: 200.0000 mg | ORAL_CAPSULE | Freq: Every day | ORAL | 0 refills | Status: DC

## 2021-06-16 NOTE — Telephone Encounter (Signed)
Pharmacy has changed to mail order.   Dr. Scarlette Ar note 01/19/21 "I gave her a new prescription for estradiol 0.5 mg tablets instructing her to take half tablet daily, with a 1 year supply.  I also gave her a new prescription for Prometrium 200 mg tablets nightly. I want her to keep [place] updated in the next few months if she is continuing to have bleeding then I told her that we would likely want to proceed with hysteroscopy D&C. We discussed the finding of the fibroids which were present in the past.  Appear slightly larger than on the last imaging done about 5 years ago in 2017, but they are still overall pretty small.  Given intramural location these are probably less likely to be contributing to the bleeding."  AEX 09/22/20

## 2021-06-21 ENCOUNTER — Other Ambulatory Visit: Payer: Managed Care, Other (non HMO)

## 2021-09-08 ENCOUNTER — Other Ambulatory Visit: Payer: Self-pay

## 2021-09-08 MED ORDER — ESTRADIOL 0.5 MG PO TABS
0.2500 mg | ORAL_TABLET | Freq: Every day | ORAL | 0 refills | Status: DC
Start: 2021-09-08 — End: 2021-10-18

## 2021-09-08 MED ORDER — PROGESTERONE 200 MG PO CAPS: 200.0000 mg | ORAL_CAPSULE | Freq: Every day | ORAL | 0 refills | Status: DC

## 2021-09-08 NOTE — Telephone Encounter (Signed)
AEX with Dr. Delilah Shan 09/22/2020 AEX scheduled with Dr. Quincy Simmonds 09/30/21

## 2021-09-30 ENCOUNTER — Ambulatory Visit: Payer: Managed Care, Other (non HMO) | Admitting: Obstetrics and Gynecology

## 2021-10-11 NOTE — Progress Notes (Deleted)
50 y.o. [id number] Married {Race/ethnicity:17218} female here for annual exam.    PCP:     Patient's last menstrual period was 06/03/2019.           Sexually active: {yes no:314532}  The current method of family planning is post menopausal status.    Exercising: {yes HA:193790}  {types:19826} Smoker:  Former; quit over 30 years ago  Health Maintenance: Pap:  07-25-18 Neg:Neg HR HPV, 10-07-14 Neg History of abnormal Pap:  {YES NO:22349} MMG:  12-17-20 3D/Neg/BiRads1 Colonoscopy:  ***NEVER BMD:   ***  Result  *** TDaP:  10-07-14 Gardasil:   no HIV:*** Hep C:*** Screening Labs:  Hb today: ***, Urine today: ***   reports that she quit smoking about 32 years ago. Her smoking use included cigarettes. She has never used smokeless tobacco. She reports current alcohol use of about 5.0 standard drinks per week. She reports that she does not currently use drugs.  Past Medical History:  Diagnosis Date   Miscarriage    Normal spontaneous vaginal delivery    2    Past Surgical History:  Procedure Laterality Date   CHOLECYSTECTOMY  2000   DILATION AND CURETTAGE OF UTERUS  1990   TONSILLECTOMY  1977    Current Outpatient Medications  Medication Sig Dispense Refill   estradiol (ESTRACE) 0.5 MG tablet Take 0.5 tablets (0.25 mg total) by mouth daily. 45 tablet 0   Multiple Vitamin (MULTIVITAMIN) capsule Take 1 capsule by mouth as needed.     progesterone (PROMETRIUM) 200 MG capsule Take 1 capsule (200 mg total) by mouth at bedtime. 90 capsule 0   No current facility-administered medications for this visit.    Family History  Problem Relation Age of Onset   Diabetes Maternal Grandmother    Leukemia Son        ALL   Lung cancer Maternal Uncle        (SMOKER)   Stomach cancer Paternal Grandmother    Hypertension Brother     Review of Systems  Exam:   LMP 06/03/2019     General appearance: alert, cooperative and appears stated age Head: normocephalic, without obvious abnormality,  atraumatic Neck: no adenopathy, supple, symmetrical, trachea midline and thyroid normal to inspection and palpation Lungs: clear to auscultation bilaterally Breasts: normal appearance, no masses or tenderness, No nipple retraction or dimpling, No nipple discharge or bleeding, No axillary adenopathy Heart: regular rate and rhythm Abdomen: soft, non-tender; no masses, no organomegaly Extremities: extremities normal, atraumatic, no cyanosis or edema Skin: skin color, texture, turgor normal. No rashes or lesions Lymph nodes: cervical, supraclavicular, and axillary nodes normal. Neurologic: grossly normal  Pelvic: External genitalia:  no lesions              No abnormal inguinal nodes palpated.              Urethra:  normal appearing urethra with no masses, tenderness or lesions              Bartholins and Skenes: normal                 Vagina: normal appearing vagina with normal color and discharge, no lesions              Cervix: no lesions              Pap taken: {yes no:314532} Bimanual Exam:  Uterus:  normal size, contour, position, consistency, mobility, non-tender  Adnexa: no mass, fullness, tenderness              Rectal exam: {yes no:314532}.  Confirms.              Anus:  normal sphincter tone, no lesions  Chaperone was present for exam:  ***  Assessment:   Well woman visit with gynecologic exam.   Plan: Mammogram screening discussed. Self breast awareness reviewed. Pap and HR HPV as above. Guidelines for Calcium, Vitamin D, regular exercise program including cardiovascular and weight bearing exercise.   Follow up annually and prn.   Additional counseling given.  {yes [id number]. _______ minutes face to face time of which over 50% was spent in counseling.    After visit summary provided.

## 2021-10-12 ENCOUNTER — Ambulatory Visit: Payer: Self-pay | Admitting: Obstetrics and Gynecology

## 2021-10-13 NOTE — Progress Notes (Signed)
50 y.o. [id number] Married Caucasian female here for annual exam.    Patient is on HRT since 09/2020. She had postmenopausal bleeding and was seen by Dr. Delilah Shan on 01/19/21.  Pelvic US showed EMS 4.4 mm and fibroids.  EMB not performed.   She is now back down to taking estradiol 0.5 and Prometrium 200 mg.  She is happy with the dosage and wants to continue.  She feels emotionally better on her HRT.  No further spotting.   Some hair loss.  Some work stress.  Lost 25 pounds intentionally in the last year.   Would like to routine blood work.   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   PCP:   none.   Patient's last menstrual period was 06/03/2019.           Sexually active: Yes.    The current method of family planning is post menopausal status.    Exercising: Yes.    Home exercise routine includes tennis. Smoker: former  Health Maintenance: Pap:  07/25/18- WNL, HPV- neg, 10/07/14- WNL, 08/30/13- WNL, HPV- neg History of abnormal Pap:  no MMG:  12/17/20- birads 1 neg  Colonoscopy:  n/a BMD:   n/a  Result  n/a TDaP:  10/07/2014 Gardasil:   no HIV: n/a Hep C: n/a Screening Labs:  Today.  Vaccines.   Completed flu vaccine Covid booster, and Shingrix.    reports that she quit smoking about 32 years ago. Her smoking use included cigarettes. She has never used smokeless tobacco. She reports current alcohol use of about 5.0 standard drinks per week. She reports that she does not currently use drugs.  Past Medical History:  Diagnosis Date   Miscarriage    Normal spontaneous vaginal delivery    2    Past Surgical History:  Procedure Laterality Date   CHOLECYSTECTOMY  2000   DILATION AND CURETTAGE OF UTERUS  1990   TONSILLECTOMY  1977    Current Outpatient Medications  Medication Sig Dispense Refill   estradiol (ESTRACE) 0.5 MG tablet Take 0.5 tablets (0.25 mg total) by mouth daily. 45 tablet 0   Multiple Vitamin (MULTIVITAMIN) capsule Take 1 capsule by mouth as needed.      progesterone (PROMETRIUM) 200 MG capsule Take 1 capsule (200 mg total) by mouth at bedtime. 90 capsule 0   No current facility-administered medications for this visit.    Family History  Problem Relation Age of Onset   Diabetes Maternal Grandmother    Leukemia Son        ALL   Lung cancer Maternal Uncle        (SMOKER)   Stomach cancer Paternal Grandmother    Hypertension Brother     Review of Systems  See HPI.   Exam:   BP 122/80 (BP Location: Left Arm, Patient Position: Sitting, Cuff Size: Normal)   Pulse 67   Ht 5' 7.5" (1.715 m)   Wt 168 lb (76.2 kg)   LMP 06/03/2019   SpO2 99%   BMI 25.92 kg/m     General appearance: alert, cooperative and appears stated age Head: normocephalic, without obvious abnormality, atraumatic Neck: no adenopathy, supple, symmetrical, trachea midline and thyroid normal to inspection and palpation Lungs: clear to auscultation bilaterally Breasts: normal appearance, no masses or tenderness, No nipple retraction or dimpling, No nipple discharge or bleeding, No axillary adenopathy Heart: regular rate and rhythm Abdomen: soft, non-tender; no masses, no organomegaly Extremities: extremities normal, atraumatic, no cyanosis or edema Skin: skin color, texture,  turgor normal. No rashes or lesions Lymph nodes: cervical, supraclavicular, and axillary nodes normal. Neurologic: grossly normal  Pelvic: External genitalia:  no lesions              No abnormal inguinal nodes palpated.              Urethra:  normal appearing urethra with no masses, tenderness or lesions              Bartholins and Skenes: normal                 Vagina: normal appearing vagina with normal color and discharge, no lesions              Cervix: no lesions              Pap taken: no Bimanual Exam:  Uterus:  6 week size.  contour, position, consistency, mobility, non-tender              Adnexa: no mass, fullness, tenderness              Rectal exam: yes.  Confirms.               Anus:  normal sphincter tone, no lesions  Chaperone was present for exam:  Kimalexis  Assessment:   Well woman visit with gynecologic exam. HRT.  Colon cancer screening.   Plan: Mammogram screening discussed. Self breast awareness reviewed. Pap and HR HPV as above. Guidelines for Calcium, Vitamin D, regular exercise program including cardiovascular and weight bearing exercise. Routine labs.  Referral for colonoscopy.  Discused WHI and use of HRT which can increase risk of PE, DVT, MI, stroke and breast cancer.  Refill of Estradiol 0.5 mg and Prometrium 200 mg daily.  #90 each, RF:  3. Follow up annually and prn.   After visit summary provided.

## 2021-10-18 ENCOUNTER — Other Ambulatory Visit: Payer: Self-pay

## 2021-10-18 ENCOUNTER — Ambulatory Visit (INDEPENDENT_AMBULATORY_CARE_PROVIDER_SITE_OTHER): Payer: Managed Care, Other (non HMO) | Admitting: Obstetrics and Gynecology

## 2021-10-18 ENCOUNTER — Encounter: Payer: Self-pay | Admitting: Obstetrics and Gynecology

## 2021-10-18 VITALS — BP 122/80 | HR 67 | Ht 67.5 in | Wt 168.0 lb

## 2021-10-18 DIAGNOSIS — Z1211 Encounter for screening for malignant neoplasm of colon: Secondary | ICD-10-CM

## 2021-10-18 DIAGNOSIS — Z01419 Encounter for gynecological examination (general) (routine) without abnormal findings: Secondary | ICD-10-CM | POA: Diagnosis not present

## 2021-10-18 MED ORDER — ESTRADIOL 0.5 MG PO TABS: ORAL_TABLET | ORAL | 3 refills | Status: DC

## 2021-10-18 MED ORDER — PROGESTERONE 200 MG PO CAPS: 200.0000 mg | ORAL_CAPSULE | Freq: Every day | ORAL | 3 refills | Status: DC

## 2021-10-18 NOTE — Patient Instructions (Signed)

## 2021-10-19 LAB — CBC
HCT: 43.8 % (ref 35.0–45.0)
Hemoglobin: 14.3 g/dL (ref 11.7–15.5)
MCH: 31.9 pg (ref 27.0–33.0)
MCHC: 32.6 g/dL (ref 32.0–36.0)
MCV: 97.8 fL (ref 80.0–100.0)
MPV: 10.7 fL (ref 7.5–12.5)
Platelets: 264 10*3/uL (ref 140–400)
RBC: 4.48 10*6/uL (ref 3.80–5.10)
RDW: 11.8 % (ref 11.0–15.0)
WBC: 9 10*3/uL (ref 3.8–10.8)

## 2021-10-19 LAB — LIPID PANEL
Cholesterol: 252 mg/dL — ABNORMAL HIGH (ref <200)
HDL: 135 mg/dL (ref >=50)
LDL Cholesterol (Calc): 100 mg/dL (calc) — ABNORMAL HIGH
Non-HDL Cholesterol (Calc): 117 mg/dL (calc) (ref <130)
Total CHOL/HDL Ratio: 1.9 (calc) (ref <5.0)
Triglycerides: 84 mg/dL (ref <150)

## 2021-10-19 LAB — COMPREHENSIVE METABOLIC PANEL
AG Ratio: 2.1 (calc) (ref 1.0–2.5)
ALT: 20 U/L (ref 6–29)
AST: 23 U/L (ref 10–35)
Albumin: 5 g/dL (ref 3.6–5.1)
Alkaline phosphatase (APISO): 40 U/L (ref 37–153)
BUN: 16 mg/dL (ref 7–25)
CO2: 28 mmol/L (ref 20–32)
Calcium: 11.2 mg/dL — ABNORMAL HIGH (ref 8.6–10.4)
Chloride: 102 mmol/L (ref 98–110)
Creat: 0.83 mg/dL (ref 0.50–1.03)
Globulin: 2.4 g/dL (calc) (ref 1.9–3.7)
Glucose, Bld: 90 mg/dL (ref 65–99)
Potassium: 5.4 mmol/L — ABNORMAL HIGH (ref 3.5–5.3)
Sodium: 138 mmol/L (ref 135–146)
Total Bilirubin: 0.4 mg/dL (ref 0.2–1.2)
Total Protein: 7.4 g/dL (ref 6.1–8.1)

## 2021-10-19 LAB — TSH: TSH: 1.87 mIU/L

## 2021-10-20 ENCOUNTER — Encounter: Payer: Self-pay | Admitting: Obstetrics and Gynecology

## 2021-10-28 ENCOUNTER — Telehealth: Payer: Self-pay | Admitting: *Deleted

## 2021-10-28 NOTE — Telephone Encounter (Signed)
Patient called and left message in triage voicemail stating her new patient appointment is scheduled in May 2023. Per Dr.Silva she wanted patient to follow up for slightly elevated potassium. I called patient back and left a detailed message on voicemail this is fine, other option is to call around to other PCP offices within [facility] health to see if sooner appointments.

## 2022-01-21 ENCOUNTER — Encounter: Payer: Self-pay | Admitting: Obstetrics and Gynecology

## 2022-02-11 ENCOUNTER — Other Ambulatory Visit: Payer: Self-pay

## 2022-02-11 ENCOUNTER — Telehealth: Payer: Self-pay

## 2022-02-11 DIAGNOSIS — N950 Postmenopausal bleeding: Secondary | ICD-10-CM

## 2022-02-11 NOTE — Telephone Encounter (Signed)
FYI. Pt notified and voiced understanding. I advised her to continue with HRTs until she is examined. Msg sent to scheduling and order for biopsy placed.  ?

## 2022-02-11 NOTE — Telephone Encounter (Signed)
Please schedule pelvic US and office visit with endometrial biopsy with me. ?

## 2022-02-11 NOTE — Telephone Encounter (Signed)
Pt calling to report hx of fibroids, on HRTs (estrogen/progesterone). Reports experiencing bright red bleeding and sxs of cycle.  ? ?Would you like [place] to schedule office visit with or without [place]?  ? ?Last cycle noted from [facility] on 10/18/21 was 06/03/2019. ? ?Please advise. Thanks.  ?

## 2022-02-12 ENCOUNTER — Other Ambulatory Visit: Payer: Self-pay | Admitting: Obstetrics and Gynecology

## 2022-02-12 DIAGNOSIS — N950 Postmenopausal bleeding: Secondary | ICD-10-CM

## 2022-02-12 NOTE — Telephone Encounter (Signed)
I placed an order for the pelvic ultrasound.  ?

## 2022-02-12 NOTE — Progress Notes (Signed)
Order placed for pelvic ultrasound for postmenopausal bleeding.  ?

## 2022-02-16 NOTE — Telephone Encounter (Signed)
FYI. Pt scheduled for 02/17/22.  ?

## 2022-02-17 ENCOUNTER — Ambulatory Visit (INDEPENDENT_AMBULATORY_CARE_PROVIDER_SITE_OTHER): Payer: Managed Care, Other (non HMO) | Admitting: Obstetrics and Gynecology

## 2022-02-17 ENCOUNTER — Other Ambulatory Visit (HOSPITAL_COMMUNITY)
Admission: RE | Admit: 2022-02-17 | Discharge: 2022-02-17 | Disposition: A | Discharge status: Home / Self Care | Payer: Managed Care, Other (non HMO) | Source: Ambulatory Visit | Attending: Obstetrics and Gynecology | Admitting: Obstetrics and Gynecology

## 2022-02-17 ENCOUNTER — Encounter: Payer: Self-pay | Admitting: Obstetrics and Gynecology

## 2022-02-17 ENCOUNTER — Other Ambulatory Visit: Payer: Self-pay

## 2022-02-17 ENCOUNTER — Ambulatory Visit: Payer: Managed Care, Other (non HMO)

## 2022-02-17 VITALS — BP 138/88 | HR 67 | Ht 67.5 in | Wt 168.0 lb

## 2022-02-17 DIAGNOSIS — R9389 Abnormal findings on diagnostic imaging of other specified body structures: Secondary | ICD-10-CM | POA: Insufficient documentation

## 2022-02-17 DIAGNOSIS — N950 Postmenopausal bleeding: Secondary | ICD-10-CM

## 2022-02-17 DIAGNOSIS — Z79890 Hormone replacement therapy (postmenopausal): Secondary | ICD-10-CM | POA: Diagnosis not present

## 2022-02-17 DIAGNOSIS — D219 Benign neoplasm of connective and other soft tissue, unspecified: Secondary | ICD-10-CM | POA: Diagnosis not present

## 2022-02-17 HISTORY — PX: ENDOMETRIAL BIOPSY: SHX622

## 2022-02-17 NOTE — Patient Instructions (Signed)

## 2022-02-17 NOTE — Progress Notes (Signed)
GYNECOLOGY  VISIT ?  ?HPI: ?51 y.o.   Married  Caucasian  female   ?[id number] with Patient's last menstrual period was 06/03/2019.   ?here for pelvic ultrasound.   ? ?Patient is having postmenopausal bleeding on HRT. ?Has periodic bleeding. ? ?Had some dark bleeding starting in end of February.  ?Has been forgetting to take her HRT at times and then had brighter red bleeding.  ?She has also reduced the estrogen by 1/2 of the dose.  ? ?Last [place] 01/19/21: ?Pelvic US showed fibroids and EMS 4.4 mm.  ? ?GYNECOLOGIC HISTORY: ?Patient's last menstrual period was 06/03/2019. ?Contraception:  PMP ?Menopausal hormone therapy:  Estradiol 0.5, Progesterone '200mg'$  ?Last mammogram:  12/17/20- birads 1 neg  ?Last pap smear:  07-25-18 Neg:Neg HR HPV, 10-07-14 Neg, 08-30-13 Neg:Neg HR HPV ?       ?OB History   ? ? Gravida  ?4  ? Para  ?2  ? Term  ?2  ? Preterm  ?   ? AB  ?2  ? Living  ?2  ?  ? ? SAB  ?1  ? IAB  ?1  ? Ectopic  ?   ? Multiple  ?   ? Live Births  ?2  ?   ?  ? Obstetric Comments  ?Twins MAB @ 3 months  ?  ? ?  ?    ? ?Patient Active Problem List  ? Diagnosis Date Noted  ? Fibroids 06/04/2012  ? ? ?Past Medical History:  ?Diagnosis Date  ? Miscarriage   ? Normal spontaneous vaginal delivery   ? 2  ? Serum calcium elevated   ? ? ?Past Surgical History:  ?Procedure Laterality Date  ? CHOLECYSTECTOMY  2000  ? DILATION AND CURETTAGE OF UTERUS  1990  ? TONSILLECTOMY  1977  ? ? ?Current Outpatient Medications  ?Medication Sig Dispense Refill  ? estradiol (ESTRACE) 0.5 MG tablet Take one tablet (0.5 mg) by mouth daily. 90 tablet 3  ? Multiple Vitamin (MULTIVITAMIN) capsule Take 1 capsule by mouth as needed.    ? progesterone (PROMETRIUM) 200 MG capsule Take 1 capsule (200 mg total) by mouth at bedtime. 90 capsule 3  ? ?No current facility-administered medications for this visit.  ?  ? ?ALLERGIES: Other ? ?Family History  ?Problem Relation Age of Onset  ? Diabetes Maternal Grandmother   ? Leukemia Son   ?     ALL  ? Lung cancer  Maternal Uncle   ?     (SMOKER)  ? Stomach cancer Paternal Grandmother   ? Hypertension Brother   ? ? ?Social History  ? ?Socioeconomic History  ? Marital status: Married  ?  Spouse name: Not on file  ? Number of children: 2  ? Years of education: Not on file  ? Highest education level: Not on file  ?Occupational History  ? Occupation: Health and safety inspector  ?Tobacco Use  ? Smoking status: Former  ?  Types: Cigarettes  ?  Quit date: 47  ?  Years since quitting: 33.2  ? Smokeless tobacco: Never  ?Vaping Use  ? Vaping Use: Never used  ?Substance and Sexual Activity  ? Alcohol use: Yes  ?  Alcohol/week: 5.0 standard drinks  ?  Types: 5 Standard drinks or equivalent per week  ?  Comment: 2 per day  ? Drug use: Not Currently  ? Sexual activity: Yes  ?  Birth control/protection: Post-menopausal  ?  Comment: 1st intercourse 51 yo-More than 5 partners  ?Other Topics Concern  ?  Not on file  ?Social History Narrative  ? Not on file  ? ?Social Determinants of Health  ? ?Financial Resource Strain: Not on file  ?Food Insecurity: Not on file  ?Transportation Needs: Not on file  ?Physical Activity: Not on file  ?Stress: Not on file  ?Social Connections: Not on file  ?Intimate Partner Violence: Not on file  ? ? ?Review of Systems  ?All other systems reviewed and are negative. ? ?PHYSICAL EXAMINATION:   ? ?BP 138/88   Pulse 67   Ht 5' 7.5" (1.715 m)   Wt 168 lb (76.2 kg)   LMP 06/03/2019   SpO2 98%   BMI 25.92 kg/m?     ?General appearance: alert, cooperative and appears stated age ?  ?Pelvic: External genitalia:  no lesions ?             Urethra:  normal appearing urethra with no masses, tenderness or lesions ?             Bartholins and Skenes: normal    ?             Vagina: normal appearing vagina with normal color and discharge, no lesions ?             Cervix: no lesions ?               ?Pelvic US ?Uterus:  8.77 x 5.26 x 5.98 cm.  ?Multiple fibroids:  1.91 cm, 1.85 cm, 1.08 cm, 1.42 cm, 2.00 cm. All intramural.  ?EMS 13.22  mm.  ? Mass.  ?Bilateral ovaries are atrophic.  Normal perfusion.  ?No adnexal masses.  ?No free fluid.  ? ?Endometrial biopsy ?Consent for procedure.  ?Sterile prep with Hibiclens.  ?Paracervical block with 10 cc 1% lidocaine, lot [id number], exp 03/29/23. ?Tenaculum to anterior cervical lip.  ?Pipelle passed to 8 cm x 2.  ?Tissue to pathology.  ?Silver nitrate used on tenaculum site.  ?Minimal EBL.  ?No complications.  ?       ?Chaperone was present for exam:  Raquel Sarna, RN ? ?ASSESSMENT ? ?Postmenopausal bleeding on HRT.  ?Thickened endometrium. ?Fibroids. ?  ?PLAN ? ?We discussed postmenopausal bleeding and thickened endometrium.  ?Etiologies may include polyp, fibroid, precancer or cancer.  ?Will follow up EMB.  ?I did mention possible treatment options my include hysteroscopy and dilation and curettage, Mirena IUD or progesterone treatment, or hysterectomy.  ?Will refer to GYN ONC if malignancy identified.  ?OK to continue HRT for now.  ?  ?An After Visit Summary was printed and given to the patient. ? ?30 min  total time was spent for this patient encounter, including preparation, face-to-face counseling with the patient, coordination of care, and documentation of the encounter. ? ? ?

## 2022-02-21 LAB — SURGICAL PATHOLOGY

## 2022-02-23 ENCOUNTER — Telehealth: Payer: Self-pay | Admitting: *Deleted

## 2022-02-23 NOTE — Telephone Encounter (Signed)
Please proceed with percert and scheduling of hysteroscopy with Myosure resection of endometrium, dilation and curettage at [facility].  ? ?Diagnoses are:  postmenopausal bleeding on hormone therapy, thickened endometrium.  ? ?She will need a preop appointment.  ? ? ?

## 2022-02-23 NOTE — Telephone Encounter (Signed)
Please let patient know that her endometrial biopsy showed inactive endometrium, which does not explain the thickened endometrium noted on pelvic ultrasound.  ? ?Options for care at this time are:   ?Proceed with a sonohysterogram to check for a mass.  ? ?OR ? ?Proceed with a hysteroscopy and dilation and curettage to directly visualize the thickened area and remove it with outpatient surgery.  ?

## 2022-02-23 NOTE — Telephone Encounter (Signed)
Patient informed she would like to proceed with D&C. ?

## 2022-02-23 NOTE — Telephone Encounter (Signed)
Patient called requesting biopsy results from office visit on 02/17/22. Please advise  ?

## 2022-02-26 DIAGNOSIS — N950 Postmenopausal bleeding: Secondary | ICD-10-CM

## 2022-02-26 HISTORY — DX: Postmenopausal bleeding: N95.0

## 2022-02-28 NOTE — Telephone Encounter (Signed)
Spoke with patient. Reviewed surgery dates. Patient request to proceed with surgery on 03/28/22.  Advised patient I will forward to business office for return call. I will return call once surgery date and time confirmed. Patient verbalizes understanding and is agreeable.  ? ?Surgery request sent.  ? ?Routing to [facility] for benefits ? ?

## 2022-02-28 NOTE — Telephone Encounter (Signed)
Spoke with patient. Patient unable to talk right now, will return call at later time to discuss surgery dates/scheduling.  ?

## 2022-03-01 NOTE — Telephone Encounter (Signed)
Left message to call Chevette Fee, RN at [facility], [phone], OPT 5.  

## 2022-03-08 ENCOUNTER — Telehealth: Payer: Self-pay

## 2022-03-08 NOTE — Telephone Encounter (Signed)
Patient's endometrium was very thickened on ultrasound, so I do suspect that the endometrium is the focus of the bleeding.  ? ?I recommend proceeding the the hysteroscopy.  ? ?All that being said, it is ok to do a pap also.  ?Please schedule with me for this week.  ? ?

## 2022-03-08 NOTE — Telephone Encounter (Signed)
Pt calling to report bright red spotting since biopsy on 02/17/22. States at one point it seemed to be letting up but is present today. Calling to inquire if she could have a pap collected to confirm that her bleeding could be coming from her cervix instead of uterus. Please advise.  ?

## 2022-03-09 NOTE — Telephone Encounter (Signed)
LDVM per DPR.  ?

## 2022-03-10 NOTE — Progress Notes (Addendum)
GYNECOLOGY  VISIT ?  ?HPI: ?51 y.o.   Married  Caucasian  female   ?[id number] with Patient's last menstrual period was 06/03/2019.   ?here for  surgery consultation. ? ?Patient is having postmenopausal bleeding on HRT. ?Has periodic bleeding. ?  ?Had some dark bleeding starting in end of February.  ?Had been forgetting to take her HRT at times and then had brighter red bleeding.  ?She has also reduced the estrogen by 1/2 of the dose.  ? ?Bleeding has now stopped.   ?Some white discharge.  ?No itching or burning.  ? ?Pelvic US 02/17/22: ?Uterus:  8.77 x 5.26 x 5.98 cm.  ?Multiple fibroids:  1.91 cm, 1.85 cm, 1.08 cm, 1.42 cm, 2.00 cm. All intramural.  ?EMS 13.22 mm.  ? Mass.  ?Bilateral ovaries are atrophic.  Normal perfusion.  ?No adnexal masses.  ?No free fluid.  ? ?EMB on 02/17/22 showed inactive endometrium and no hyperplasia or malignancy, which did not correlate with ultrasound findings.  ? ?She will see Dr. Sharlet Salina in May.  ?Elevated calcium noted on chart review. ? ?GYNECOLOGIC HISTORY: ?Patient's last menstrual period was 06/03/2019. ?Contraception:  postmenopausal ?Menopausal hormone therapy:  Yes progesterone, estradiol ?Last mammogram:  12-17-20 Normal BI RADS 1 Cat B Density ?Last pap smear:   07-25-18 Normal neg HPV ?       ?OB History   ? ? Gravida  ?4  ? Para  ?2  ? Term  ?2  ? Preterm  ?   ? AB  ?2  ? Living  ?2  ?  ? ? SAB  ?1  ? IAB  ?1  ? Ectopic  ?   ? Multiple  ?   ? Live Births  ?2  ?   ?  ? Obstetric Comments  ?Twins MAB @ 3 months  ?  ? ?  ?    ? ?Patient Active Problem List  ? Diagnosis Date Noted  ? Fibroids 06/04/2012  ? ? ?Past Medical History:  ?Diagnosis Date  ? Miscarriage   ? Normal spontaneous vaginal delivery   ? 2  ? Serum calcium elevated   ? ? ?Past Surgical History:  ?Procedure Laterality Date  ? CHOLECYSTECTOMY  2000  ? DILATION AND CURETTAGE OF UTERUS  1990  ? TONSILLECTOMY  1977  ? ? ?Current Outpatient Medications  ?Medication Sig Dispense Refill  ? estradiol (ESTRACE) 0.5 MG  tablet Take one tablet (0.5 mg) by mouth daily. 90 tablet 3  ? Multiple Vitamin (MULTIVITAMIN) capsule Take 1 capsule by mouth as needed.    ? progesterone (PROMETRIUM) 200 MG capsule Take 1 capsule (200 mg total) by mouth at bedtime. 90 capsule 3  ? ?No current facility-administered medications for this visit.  ?  ? ?ALLERGIES: Other ? ?Family History  ?Problem Relation Age of Onset  ? Diabetes Maternal Grandmother   ? Leukemia Son   ?     ALL  ? Lung cancer Maternal Uncle   ?     (SMOKER)  ? Stomach cancer Paternal Grandmother   ? Hypertension Brother   ? ? ?Social History  ? ?Socioeconomic History  ? Marital status: Married  ?  Spouse name: Not on file  ? Number of children: 2  ? Years of education: Not on file  ? Highest education level: Not on file  ?Occupational History  ? Occupation: Health and safety inspector  ?Tobacco Use  ? Smoking status: Former  ?  Types: Cigarettes  ?  Quit date: 12  ?  Years since quitting: 33.3  ? Smokeless tobacco: Never  ?Vaping Use  ? Vaping Use: Never used  ?Substance and Sexual Activity  ? Alcohol use: Yes  ?  Alcohol/week: 5.0 standard drinks  ?  Types: 5 Standard drinks or equivalent per week  ?  Comment: 2 per day  ? Drug use: Not Currently  ? Sexual activity: Yes  ?  Birth control/protection: Post-menopausal  ?  Comment: 1st intercourse 51 yo-More than 5 partners  ?Other Topics Concern  ? Not on file  ?Social History Narrative  ? Not on file  ? ?Social Determinants of Health  ? ?Financial Resource Strain: Not on file  ?Food Insecurity: Not on file  ?Transportation Needs: Not on file  ?Physical Activity: Not on file  ?Stress: Not on file  ?Social Connections: Not on file  ?Intimate Partner Violence: Not on file  ? ? ?Review of Systems  See HPI.  ? ?PHYSICAL EXAMINATION:   ? ?BP 122/80 (BP Location: Right Arm, Patient Position: Sitting, Cuff Size: Normal)   Pulse 68   Ht 5' 7.5" (1.715 m)   Wt 168 lb (76.2 kg)   LMP 06/03/2019   BMI 25.92 kg/m?     ?General appearance: alert,  cooperative and appears stated age ?Head: Normocephalic, without obvious abnormality, atraumatic ?Neck: no adenopathy, supple, symmetrical, trachea midline and thyroid normal to inspection and palpation ?Lungs: clear to auscultation bilaterally ?Heart: regular rate and rhythm ?Abdomen: soft, non-tender, no masses,  no organomegaly ?Extremities: extremities normal, atraumatic, no cyanosis or edema ?Skin: Skin color, texture, turgor normal. No rashes or lesions ?No abnormal inguinal nodes palpated ?Neurologic: Grossly normal ? ?Pelvic: External genitalia:  no lesions ?             Urethra:  normal appearing urethra with no masses, tenderness or lesions ?             Bartholins and Skenes: normal    ?             Vagina: normal appearing vagina with normal color and discharge, no lesions ?             Cervix: no lesions ?               ?Bimanual Exam:  Uterus:  normal size, contour, position, consistency, mobility, non-tender ?             Adnexa: no mass, fullness, tenderness ?        ?Chaperone was present for exam:  yes.  ? ?ASSESSMENT ? ?Postmenopausal bleeding on HRT.  ?Thickened endometrium. ?Fibroids. ?Cervical cancer screening.  ?Elevated calcium.  ? ?PLAN ? ?Pap and HR HPV collected. ?CMP.  ? ?Discussion of hysteroscopy with Myosure resection of endometrial thickening, dilation and curettage.  ?Risks, benefits, and alternatives reviewed. ?Risks include but are not limited to bleeding, infection, damage to surrounding organs including uterine perforation requiring hospitalization and laparoscopy, pulmonary edema, reaction to anesthesia, DVT, PE, death, need for further treatment. ?Surgical expectations and recovery discussed.  ?She understands that the hysteroscopy is not able to treat intramural fibroids. ?Patient wishes to proceed. ?  ?An After Visit Summary was printed and given to the patient. ? ? ? ?  ?

## 2022-03-14 NOTE — Telephone Encounter (Signed)
Called pt back to f/u with her. Pt said if ok, she would just wait and do the pap at her pre-op visit on 03/18/22 if ok with you.  ?

## 2022-03-14 NOTE — Telephone Encounter (Signed)
Pap on 03/18/22 at preop visit should give enough time to get the result back prior to surgery. ?

## 2022-03-15 NOTE — Telephone Encounter (Signed)
Left message to call Jaylan Hinojosa, RN at [facility], [phone].  

## 2022-03-18 ENCOUNTER — Other Ambulatory Visit (HOSPITAL_COMMUNITY)
Admission: RE | Admit: 2022-03-18 | Discharge: 2022-03-18 | Disposition: A | Discharge status: Home / Self Care | Payer: Managed Care, Other (non HMO) | Source: Ambulatory Visit | Attending: Obstetrics and Gynecology | Admitting: Obstetrics and Gynecology

## 2022-03-18 ENCOUNTER — Ambulatory Visit (INDEPENDENT_AMBULATORY_CARE_PROVIDER_SITE_OTHER): Payer: Managed Care, Other (non HMO) | Admitting: Obstetrics and Gynecology

## 2022-03-18 ENCOUNTER — Encounter: Payer: Self-pay | Admitting: Obstetrics and Gynecology

## 2022-03-18 VITALS — BP 122/80 | HR 68 | Ht 67.5 in | Wt 168.0 lb

## 2022-03-18 DIAGNOSIS — Z124 Encounter for screening for malignant neoplasm of cervix: Secondary | ICD-10-CM | POA: Insufficient documentation

## 2022-03-18 DIAGNOSIS — N950 Postmenopausal bleeding: Secondary | ICD-10-CM | POA: Diagnosis present

## 2022-03-18 DIAGNOSIS — R9389 Abnormal findings on diagnostic imaging of other specified body structures: Secondary | ICD-10-CM | POA: Diagnosis not present

## 2022-03-18 NOTE — Telephone Encounter (Signed)
Spoke with patient while in office for pre-op. Surgery date request confirmed.  ?Advised surgery is scheduled for 03/28/22, [place] at 0730.  ?Surgery instruction sheet and hospital brochure reviewed, printed copy will be mailed.  ?Patient verbalizes understanding and is agreeable.  ?Routing to [facility] for benefits.  ? ?

## 2022-03-19 DIAGNOSIS — N950 Postmenopausal bleeding: Secondary | ICD-10-CM | POA: Insufficient documentation

## 2022-03-19 LAB — COMPREHENSIVE METABOLIC PANEL
AG Ratio: 2 (calc) (ref 1.0–2.5)
ALT: 20 U/L (ref 6–29)
AST: 24 U/L (ref 10–35)
Albumin: 4.6 g/dL (ref 3.6–5.1)
Alkaline phosphatase (APISO): 36 U/L — ABNORMAL LOW (ref 37–153)
BUN: 11 mg/dL (ref 7–25)
CO2: 28 mmol/L (ref 20–32)
Calcium: 10.4 mg/dL (ref 8.6–10.4)
Chloride: 102 mmol/L (ref 98–110)
Creat: 0.79 mg/dL (ref 0.50–1.03)
Globulin: 2.3 g/dL (calc) (ref 1.9–3.7)
Glucose, Bld: 86 mg/dL (ref 65–99)
Potassium: 4.8 mmol/L (ref 3.5–5.3)
Sodium: 138 mmol/L (ref 135–146)
Total Bilirubin: 0.7 mg/dL (ref 0.2–1.2)
Total Protein: 6.9 g/dL (ref 6.1–8.1)

## 2022-03-20 NOTE — H&P (Addendum)
Nunzio Cobbs, MD ?Physician ?Gynecology ?Progress Notes     ?Addendum ?Encounter Date:  03/18/2022 ? Related encounter: Office Visit from 03/18/2022 in [facility] ?  ?   ?   ?   ?   ?   ?   ?   ?   ?   ?   ?   ?   ?   ?   ?   ?   ?   ?   ?   ?   ?   ?   ?   ?   ?   ?   ?   ?   ?   ?   ?   ?   ?   ?   ?   ?   ?   ?   ?   ?   ?   ?   ?   ?   ?   ?   ?   ?   ?   ?   ?   ?   ?   ?   ?   ?   ?   ?   ?   ?   ?   ?   ?   ?   ?   ?   ?   ?   ?   ?   ?   ?   ?   ?   ?   ?   ?   ?   ?   ?   ?   ?   ?   ?   ?   ?   ? ?GYNECOLOGY  VISIT ?  ?HPI: ?51 y.o.   Married  Caucasian  female   ?[id number] with Patient's last menstrual period was 06/03/2019.   ?here for  surgery consultation. ?  ?Patient is having postmenopausal bleeding on HRT. ?Has periodic bleeding. ?  ?Had some dark bleeding starting in end of February.  ?Had been forgetting to take her HRT at times and then had brighter red bleeding.  ?She has also reduced the estrogen by 1/2 of the dose.  ? ?Bleeding has now stopped.   ?Some white discharge.  ?No itching or burning.  ?  ?Pelvic US 02/17/22: ?Uterus:  8.77 x 5.26 x 5.98 cm.  ?Multiple fibroids:  1.91 cm, 1.85 cm, 1.08 cm, 1.42 cm, 2.00 cm. All intramural.  ?EMS 13.22 mm.  ? Mass.  ?Bilateral ovaries are atrophic.  Normal perfusion.  ?No adnexal masses.  ?No free fluid.  ? ?EMB on 02/17/22 showed inactive endometrium and no hyperplasia or malignancy, which did not correlate with ultrasound findings.  ?  ?She will see Dr. Sharlet Salina in May.  ?Elevated calcium noted on chart review. ?  ?GYNECOLOGIC HISTORY: ?Patient's last menstrual period was 06/03/2019. ?Contraception:  postmenopausal ?Menopausal hormone therapy:  Yes progesterone, estradiol ?Last mammogram:  12-17-20 Normal BI RADS 1 Cat B Density ?Last pap smear:   07-25-18 Normal neg HPV ?       ?OB History   ?  ?  Gravida  ?4  ? Para  ?2  ? Term  ?2  ? Preterm  ?   ? AB  ?2  ? Living  ?2  ?  ?  ?  SAB  ?1  ? IAB  ?1  ? Ectopic  ?   ?  Multiple  ?   ? Live Births  ?2  ?    ?  ?  Obstetric Comments  ?Twins MAB @ 3 months  ?  ?  ?   ?    ?  ?    ?Patient Active Problem List  ?  Diagnosis Date Noted  ? Fibroids 06/04/2012  ?  ?  ?    ?Past Medical History:  ?Diagnosis Date  ? Miscarriage    ? Normal spontaneous vaginal delivery    ?  2  ? Serum calcium elevated    ?  ?  ?     ?Past Surgical History:  ?Procedure Laterality Date  ? CHOLECYSTECTOMY   2000  ? DILATION AND CURETTAGE OF UTERUS   1990  ? TONSILLECTOMY   1977  ?  ?  ?      ?Current Outpatient Medications  ?Medication Sig Dispense Refill  ? estradiol (ESTRACE) 0.5 MG tablet Take one tablet (0.5 mg) by mouth daily. 90 tablet 3  ? Multiple Vitamin (MULTIVITAMIN) capsule Take 1 capsule by mouth as needed.      ? progesterone (PROMETRIUM) 200 MG capsule Take 1 capsule (200 mg total) by mouth at bedtime. 90 capsule 3  ?  ?No current facility-administered medications for this visit.  ?  ?  ?ALLERGIES: Other ?  ?     ?Family History  ?Problem Relation Age of Onset  ? Diabetes Maternal Grandmother    ? Leukemia Son    ?      ALL  ? Lung cancer Maternal Uncle    ?      (SMOKER)  ? Stomach cancer Paternal Grandmother    ? Hypertension Brother    ?  ?  ?Social History  ?  ?     ?Socioeconomic History  ? Marital status: Married  ?    Spouse name: Not on file  ? Number of children: 2  ? Years of education: Not on file  ? Highest education level: Not on file  ?Occupational History  ? Occupation: Health and safety inspector  ?Tobacco Use  ? Smoking status: Former  ?    Types: Cigarettes  ?    Quit date: 57  ?    Years since quitting: 33.3  ? Smokeless tobacco: Never  ?Vaping Use  ? Vaping Use: Never used  ?Substance and Sexual Activity  ? Alcohol use: Yes  ?    Alcohol/week: 5.0 standard drinks  ?    Types: 5 Standard drinks or equivalent per week  ?    Comment: 2 per day  ? Drug use: Not Currently  ? Sexual activity: Yes  ?    Birth control/protection: Post-menopausal  ?    Comment: 1st intercourse 51 yo-More than 5  partners  ?Other Topics Concern  ? Not on file  ?Social History Narrative  ? Not on file  ?  ?Social Determinants of Health  ?  ?Financial Resource Strain: Not on file  ?Food Insecurity: Not on file  ?Transportation Needs: Not on file  ?Physical Activity: Not on file  ?Stress: Not on file  ?Social Connections: Not on file  ?Intimate Partner Violence: Not on file  ?  ?  ?Review of Systems  See HPI.  ?  ?PHYSICAL EXAMINATION:   ?  ?BP 122/80 (BP Location: Right Arm, Patient Position: Sitting, Cuff Size: Normal)   Pulse 68   Ht 5' 7.5" (1.715 m)   Wt 168 lb (76.2 kg)   LMP 06/03/2019   BMI 25.92 kg/m?     ?General appearance: alert, cooperative and appears stated age ?Head: Normocephalic,  without obvious abnormality, atraumatic ?Neck: no adenopathy, supple, symmetrical, trachea midline and thyroid normal to inspection and palpation ?Lungs: clear to auscultation bilaterally ?Heart: regular rate and rhythm ?Abdomen: soft, non-tender, no masses,  no organomegaly ?Extremities: extremities normal, atraumatic, no cyanosis or edema ?Skin: Skin color, texture, turgor normal. No rashes or lesions ?No abnormal inguinal nodes palpated ?Neurologic: Grossly normal ?  ?Pelvic: External genitalia:  no lesions ?             Urethra:  normal appearing urethra with no masses, tenderness or lesions ?             Bartholins and Skenes: normal    ?             Vagina: normal appearing vagina with normal color and discharge, no lesions ?             Cervix: no lesions ?               ?Bimanual Exam:  Uterus:  normal size, contour, position, consistency, mobility, non-tender ?             Adnexa: no mass, fullness, tenderness ?        ?Chaperone was present for exam:  yes.  ?  ?ASSESSMENT ?  ?Postmenopausal bleeding on HRT.  ?Thickened endometrium. ?Fibroids. ?Cervical cancer screening.  ?Elevated calcium.  ?  ?PLAN ?  ?Pap and HR HPV collected. ?CMP.  ?  ?Discussion of hysteroscopy with Myosure resection of endometrial thickening,  dilation and curettage.  ?Risks, benefits, and alternatives reviewed. ?Risks include but are not limited to bleeding, infection, damage to surrounding organs including uterine perforation requiring hospitalization and laparoscopy, pulmonary edema, reaction to anesthesia, DVT, PE, death, need for further treatment. ?Surgical expectations and recovery discussed.  ?She understands that the hysteroscopy is not able to treat intramural fibroids. ?Patient wishes to proceed. ?  ?An After Visit Summary was printed and given to the patient. ?  ?  ?  ?   ?  ? ? ?

## 2022-03-24 ENCOUNTER — Encounter (HOSPITAL_COMMUNITY)
Admission: RE | Admit: 2022-03-24 | Discharge: 2022-03-24 | Disposition: A | Discharge status: Home / Self Care | Payer: Managed Care, Other (non HMO) | Source: Ambulatory Visit | Attending: Obstetrics and Gynecology | Admitting: Obstetrics and Gynecology

## 2022-03-24 DIAGNOSIS — R9389 Abnormal findings on diagnostic imaging of other specified body structures: Secondary | ICD-10-CM | POA: Diagnosis not present

## 2022-03-24 DIAGNOSIS — N950 Postmenopausal bleeding: Secondary | ICD-10-CM | POA: Diagnosis not present

## 2022-03-24 DIAGNOSIS — Z01812 Encounter for preprocedural laboratory examination: Secondary | ICD-10-CM | POA: Diagnosis present

## 2022-03-24 LAB — CBC
HCT: 40.8 % (ref 36.0–46.0)
Hemoglobin: 14.2 g/dL (ref 12.0–15.0)
MCH: 33.4 pg (ref 26.0–34.0)
MCHC: 34.8 g/dL (ref 30.0–36.0)
MCV: 96 fL (ref 80.0–100.0)
Platelets: 268 10*3/uL (ref 150–400)
RBC: 4.25 MIL/uL (ref 3.87–5.11)
RDW: 12.6 % (ref 11.5–15.5)
WBC: 7.1 10*3/uL (ref 4.0–10.5)
nRBC: 0 % (ref 0.0–0.2)

## 2022-03-24 LAB — BASIC METABOLIC PANEL
Anion gap: 9 (ref 5–15)
BUN: 16 mg/dL (ref 6–20)
CO2: 23 mmol/L (ref 22–32)
Calcium: 10.3 mg/dL (ref 8.9–10.3)
Chloride: 107 mmol/L (ref 98–111)
Creatinine, Ser: 0.72 mg/dL (ref 0.44–1.00)
GFR, Estimated: >60 mL/min (ref >60)
Glucose, Bld: 98 mg/dL (ref 70–99)
Potassium: 4.3 mmol/L (ref 3.5–5.1)
Sodium: 139 mmol/L (ref 135–145)

## 2022-03-24 LAB — CYTOLOGY - PAP
Comment: NEGATIVE
Diagnosis: NEGATIVE
Diagnosis: REACTIVE
High risk HPV: NEGATIVE

## 2022-03-25 ENCOUNTER — Other Ambulatory Visit: Payer: Self-pay

## 2022-03-25 ENCOUNTER — Encounter (HOSPITAL_BASED_OUTPATIENT_CLINIC_OR_DEPARTMENT_OTHER): Payer: Self-pay | Admitting: Obstetrics and Gynecology

## 2022-03-25 NOTE — Progress Notes (Signed)
Spoke w/ via phone for pre-op interview---Perry ?Lab needs dos----none               ?Lab results------03/24/22 CBC, BMP in Epic, 03/18/22 CMP in Epic ?COVID test -----patient states asymptomatic no test needed ?Arrive at -------0530 on Monday, 03/28/22 ?NPO after MN NO Solid Food.  Clear liquids from MN until---0430 ?Med rec completed ?Medications to take morning of surgery -----none ?Diabetic medication -----n/a ?You may take a shower, brush your teeth and put on deodorant the morning of surgery. ?Patient instructed no nail polish to be worn day of surgery. ?Patient instructed to bring photo id and insurance card day of surgery ?Patient aware to have Driver (ride ) / caregiver    for 24 hours after surgery - husband, Gershon Mussel ?Patient Special Instructions -----none ?Pre-Op special Istructions -----none ?Patient verbalized understanding of instructions that were given at this phone interview. ?Patient denies shortness of breath, chest pain, fever, cough at this phone interview.  ?

## 2022-03-27 NOTE — Anesthesia Preprocedure Evaluation (Addendum)
Anesthesia Evaluation  ?Patient identified by MRN, date of birth, ID band ?Patient awake ? ? ? ?Reviewed: ?Allergy & Precautions, NPO status , Patient's Chart, lab work & pertinent test results ? ?Airway ?Mallampati: II ? ?TM Distance: >3 FB ?Neck ROM: Full ? ? ? Dental ?no notable dental hx. ? ?  ?Pulmonary ?neg pulmonary ROS, former smoker,  ?  ?Pulmonary exam normal ?breath sounds clear to auscultation ? ? ? ? ? ? Cardiovascular ?negative cardio ROS ?Normal cardiovascular exam ?Rhythm:Regular Rate:Normal ? ? ?  ?Neuro/Psych ? Headaches, negative neurological ROS ? negative psych ROS  ? GI/Hepatic ?negative GI ROS, (+)  ?  ? substance abuse ? alcohol use,   ?Endo/Other  ?negative endocrine ROS ? Renal/GU ?negative Renal ROS  ?negative genitourinary ?  ?Musculoskeletal ?negative musculoskeletal ROS ?(+)  ? Abdominal ?  ?Peds ?negative pediatric ROS ?(+)  Hematology ?negative hematology ROS ?(+)   ?Anesthesia Other Findings ?Post menopausal bleeding ? Reproductive/Obstetrics ?negative OB ROS ? ?  ? ? ? ? ? ? ? ? ? ? ? ? ? ?  ?  ? ? ? ? ? ? ? ?Anesthesia Physical ?Anesthesia Plan ? ?ASA: 2 ? ?Anesthesia Plan: General  ? ?Post-op Pain Management: Tylenol PO (pre-op)*  ? ?Induction: Intravenous ? ?PONV Risk Score and Plan: Treatment may vary due to age or medical condition, Scopolamine patch - Pre-op, Midazolam, Dexamethasone and Ondansetron ? ?Airway Management Planned: LMA ? ?Additional Equipment: None ? ?Intra-op Plan:  ? ?Post-operative Plan: Extubation in OR ? ?Informed Consent: I have reviewed the patients History and Physical, chart, labs and discussed the procedure including the risks, benefits and alternatives for the proposed anesthesia with the patient or authorized representative who has indicated his/her understanding and acceptance.  ? ? ? ?Dental advisory given ? ?Plan Discussed with: Anesthesiologist and CRNA ? ?Anesthesia Plan Comments:   ? ? ? ? ? ?Anesthesia Quick  Evaluation ? ?

## 2022-03-28 ENCOUNTER — Ambulatory Visit (HOSPITAL_BASED_OUTPATIENT_CLINIC_OR_DEPARTMENT_OTHER)
Admission: RE | Admit: 2022-03-28 | Discharge: 2022-03-28 | Disposition: A | Discharge status: Home / Self Care | Payer: Managed Care, Other (non HMO) | Attending: Obstetrics and Gynecology | Admitting: Obstetrics and Gynecology

## 2022-03-28 ENCOUNTER — Ambulatory Visit (HOSPITAL_BASED_OUTPATIENT_CLINIC_OR_DEPARTMENT_OTHER): Payer: Managed Care, Other (non HMO) | Admitting: Anesthesiology

## 2022-03-28 ENCOUNTER — Encounter (HOSPITAL_BASED_OUTPATIENT_CLINIC_OR_DEPARTMENT_OTHER)
Admission: RE | Disposition: A | Discharge status: Home / Self Care | Payer: Self-pay | Source: Home / Self Care | Attending: Obstetrics and Gynecology

## 2022-03-28 ENCOUNTER — Other Ambulatory Visit: Payer: Self-pay

## 2022-03-28 ENCOUNTER — Encounter (HOSPITAL_BASED_OUTPATIENT_CLINIC_OR_DEPARTMENT_OTHER): Payer: Self-pay | Admitting: Obstetrics and Gynecology

## 2022-03-28 DIAGNOSIS — R9389 Abnormal findings on diagnostic imaging of other specified body structures: Secondary | ICD-10-CM

## 2022-03-28 DIAGNOSIS — Z87891 Personal history of nicotine dependence: Secondary | ICD-10-CM | POA: Insufficient documentation

## 2022-03-28 DIAGNOSIS — Z79890 Hormone replacement therapy (postmenopausal): Secondary | ICD-10-CM | POA: Diagnosis not present

## 2022-03-28 DIAGNOSIS — D251 Intramural leiomyoma of uterus: Secondary | ICD-10-CM | POA: Insufficient documentation

## 2022-03-28 DIAGNOSIS — N950 Postmenopausal bleeding: Secondary | ICD-10-CM

## 2022-03-28 HISTORY — PX: DILATATION & CURETTAGE/HYSTEROSCOPY WITH MYOSURE: SHX6511

## 2022-03-28 HISTORY — DX: Headache, unspecified: R51.9

## 2022-03-28 HISTORY — DX: Presence of spectacles and contact lenses: Z97.3

## 2022-03-28 SURGERY — DILATATION & CURETTAGE/HYSTEROSCOPY WITH MYOSURE
Anesthesia: General | Site: Vagina

## 2022-03-28 MED ORDER — ACETAMINOPHEN 500 MG PO TABS
ORAL_TABLET | ORAL | Status: AC
  Filled 2022-03-28: qty 2

## 2022-03-28 MED ORDER — SCOPOLAMINE 1 MG/3DAYS TD PT72
1.0000 | MEDICATED_PATCH | TRANSDERMAL | Status: DC
  Administered 2022-03-28: 1.5 mg via TRANSDERMAL

## 2022-03-28 MED ORDER — ONDANSETRON HCL 4 MG/2ML IJ SOLN: 4.0000 mg | Freq: Once | INTRAMUSCULAR | Status: DC | PRN

## 2022-03-28 MED ORDER — PROPOFOL 10 MG/ML IV BOLUS
INTRAVENOUS | Status: DC | PRN
  Administered 2022-03-28: 200 mg via INTRAVENOUS

## 2022-03-28 MED ORDER — AMISULPRIDE (ANTIEMETIC) 5 MG/2ML IV SOLN: 10.0000 mg | Freq: Once | INTRAVENOUS | Status: DC | PRN

## 2022-03-28 MED ORDER — LIDOCAINE HCL 1 % IJ SOLN
INTRAMUSCULAR | Status: DC | PRN
  Administered 2022-03-28: 10 mL

## 2022-03-28 MED ORDER — POVIDONE-IODINE 10 % EX SWAB: 2.0000 "application " | Freq: Once | CUTANEOUS | Status: DC

## 2022-03-28 MED ORDER — FENTANYL CITRATE (PF) 100 MCG/2ML IJ SOLN: 25.0000 ug | INTRAMUSCULAR | Status: DC | PRN

## 2022-03-28 MED ORDER — MIDAZOLAM HCL 2 MG/2ML IJ SOLN
INTRAMUSCULAR | Status: AC
  Filled 2022-03-28: qty 2

## 2022-03-28 MED ORDER — ONDANSETRON HCL 4 MG/2ML IJ SOLN
INTRAMUSCULAR | Status: DC | PRN
  Administered 2022-03-28: 4 mg via INTRAVENOUS

## 2022-03-28 MED ORDER — LACTATED RINGERS IV SOLN: INTRAVENOUS | Status: DC

## 2022-03-28 MED ORDER — FENTANYL CITRATE (PF) 100 MCG/2ML IJ SOLN
INTRAMUSCULAR | Status: AC
  Filled 2022-03-28: qty 2

## 2022-03-28 MED ORDER — SODIUM CHLORIDE 0.9 % IR SOLN
Status: DC | PRN
  Administered 2022-03-28: 3000 mL

## 2022-03-28 MED ORDER — OXYCODONE HCL 5 MG PO TABS: 5.0000 mg | ORAL_TABLET | Freq: Once | ORAL | Status: DC | PRN

## 2022-03-28 MED ORDER — IBUPROFEN 800 MG PO TABS: 800.0000 mg | ORAL_TABLET | Freq: Three times a day (TID) | ORAL | 0 refills | Status: AC | PRN

## 2022-03-28 MED ORDER — KETOROLAC TROMETHAMINE 30 MG/ML IJ SOLN
INTRAMUSCULAR | Status: DC | PRN
  Administered 2022-03-28: 30 mg via INTRAVENOUS

## 2022-03-28 MED ORDER — PROPOFOL 10 MG/ML IV BOLUS
INTRAVENOUS | Status: AC
Start: 2022-03-28 — End: ?
  Filled 2022-03-28: qty 20

## 2022-03-28 MED ORDER — FENTANYL CITRATE (PF) 250 MCG/5ML IJ SOLN
INTRAMUSCULAR | Status: DC | PRN
  Administered 2022-03-28 (×2): 25 ug via INTRAVENOUS
  Administered 2022-03-28: 50 ug via INTRAVENOUS

## 2022-03-28 MED ORDER — OXYCODONE HCL 5 MG/5ML PO SOLN: 5.0000 mg | Freq: Once | ORAL | Status: DC | PRN

## 2022-03-28 MED ORDER — DEXAMETHASONE SODIUM PHOSPHATE 10 MG/ML IJ SOLN
INTRAMUSCULAR | Status: DC | PRN
  Administered 2022-03-28: 10 mg via INTRAVENOUS

## 2022-03-28 MED ORDER — SCOPOLAMINE 1 MG/3DAYS TD PT72
MEDICATED_PATCH | TRANSDERMAL | Status: AC
Start: 2022-03-28 — End: ?
  Filled 2022-03-28: qty 1

## 2022-03-28 MED ORDER — LIDOCAINE 2% (20 MG/ML) 5 ML SYRINGE
INTRAMUSCULAR | Status: DC | PRN
  Administered 2022-03-28: 60 mg via INTRAVENOUS

## 2022-03-28 MED ORDER — ACETAMINOPHEN 500 MG PO TABS
1000.0000 mg | ORAL_TABLET | Freq: Once | ORAL | Status: AC
  Administered 2022-03-28: 1000 mg via ORAL

## 2022-03-28 MED ORDER — MIDAZOLAM HCL 2 MG/2ML IJ SOLN
INTRAMUSCULAR | Status: DC | PRN
Start: 2022-03-28 — End: 2022-03-28
  Administered 2022-03-28: 2 mg via INTRAVENOUS

## 2022-03-28 SURGICAL SUPPLY — 20 items
CATH ROBINSON RED A/P 16FR (CATHETERS) ×2 IMPLANT
DEVICE MYOSURE LITE (MISCELLANEOUS) IMPLANT
DEVICE MYOSURE REACH (MISCELLANEOUS) ×1 IMPLANT
DILATOR CANAL MILEX (MISCELLANEOUS) IMPLANT
DRSG TELFA 3X8 NADH (GAUZE/BANDAGES/DRESSINGS) ×2 IMPLANT
GAUZE 4X4 16PLY ~~LOC~~+RFID DBL (SPONGE) ×3 IMPLANT
GLOVE BIO SURGEON STRL SZ 6.5 (GLOVE) ×2 IMPLANT
GOWN STRL REUS W/TWL LRG LVL3 (GOWN DISPOSABLE) ×2 IMPLANT
IV NS IRRIG 3000ML ARTHROMATIC (IV SOLUTION) ×2 IMPLANT
KIT PROCEDURE FLUENT (KITS) ×2 IMPLANT
KIT TURNOVER CYSTO (KITS) ×2 IMPLANT
MYOSURE XL FIBROID (MISCELLANEOUS)
PACK VAGINAL MINOR WOMEN LF (CUSTOM PROCEDURE TRAY) ×2 IMPLANT
PAD DRESSING TELFA 3X8 NADH (GAUZE/BANDAGES/DRESSINGS) ×1 IMPLANT
PAD OB MATERNITY 4.3X12.25 (PERSONAL CARE ITEMS) ×2 IMPLANT
SEAL CERVICAL OMNI LOK (ABLATOR) IMPLANT
SEAL ROD LENS SCOPE MYOSURE (ABLATOR) ×2 IMPLANT
SOL PREP POV-IOD 4OZ 10% (MISCELLANEOUS) ×1 IMPLANT
SYSTEM TISS REMOVAL MYOSURE XL (MISCELLANEOUS) IMPLANT
TOWEL OR 17X26 10 PK STRL BLUE (TOWEL DISPOSABLE) ×2 IMPLANT

## 2022-03-28 NOTE — Transfer of Care (Signed)
Immediate Anesthesia Transfer of Care Note ? ?Patient: Madeline Rodgers ? ?Procedure(s) Performed: DILATATION & CURETTAGE/HYSTEROSCOPY WITH MYOSURE (Vagina ) ? ?Patient Location: PACU ? ?Anesthesia Type:General ? ?Level of Consciousness: awake, alert  and oriented ? ?Airway & Oxygen Therapy: Patient Spontanous Breathing ? ?Post-op Assessment: Report given to RN and Post -op Vital signs reviewed and stable ? ?Post vital signs: Reviewed and stable ? ?Last Vitals:  ?Vitals Value Taken Time  ?BP 120/80 03/28/22 0819  ?Temp    ?Pulse 58 03/28/22 0821  ?Resp 20 03/28/22 0821  ?SpO2 97 % 03/28/22 0821  ?Vitals shown include unvalidated device data. ? ?Last Pain:  ?Vitals:  ? 03/28/22 0557  ?TempSrc: Oral  ?PainSc: 2   ?   ? ?Patients Stated Pain Goal: 5 (03/28/22 0557) ? ?Complications: No notable events documented. ?

## 2022-03-28 NOTE — Anesthesia Postprocedure Evaluation (Signed)
Anesthesia Post Note ? ?Patient: Madeline Rodgers ? ?Procedure(s) Performed: DILATATION & CURETTAGE/HYSTEROSCOPY WITH MYOSURE (Vagina ) ? ?  ? ?Patient location during evaluation: PACU ?Anesthesia Type: General ?Level of consciousness: awake ?Pain management: pain level controlled ?Vital Signs Assessment: post-procedure vital signs reviewed and stable ?Respiratory status: spontaneous breathing and respiratory function stable ?Cardiovascular status: stable ?Postop Assessment: no apparent nausea or vomiting ?Anesthetic complications: no ? ? ?No notable events documented. ? ?Last Vitals:  ?Vitals:  ? 03/28/22 0845 03/28/22 0913  ?BP: 109/85 123/87  ?Pulse: (!) 54 (!) 57  ?Resp: (!) 9 16  ?Temp:  36.5 ?C  ?SpO2: 99% 100%  ?  ?Last Pain:  ?Vitals:  ? 03/28/22 0913  ?TempSrc:   ?PainSc: 4   ? ? ?  ?  ?  ?  ?  ?  ? ?Merlinda Frederick ? ? ? ? ?

## 2022-03-28 NOTE — Anesthesia Procedure Notes (Signed)
Procedure Name: LMA Insertion ?Date/Time: 03/28/2022 7:35 AM ?Performed by: Clearnce Sorrel, CRNA ?Pre-anesthesia Checklist: Patient identified, Emergency Drugs available, Suction available and Patient being monitored ?Patient Re-evaluated:Patient Re-evaluated prior to induction ?Oxygen Delivery Method: Circle System Utilized ?Preoxygenation: Pre-oxygenation with 100% oxygen ?Induction Type: IV induction ?Ventilation: Mask ventilation without difficulty ?LMA: LMA inserted ?LMA Size: 4.0 ?Number of attempts: 1 ?Airway Equipment and Method: Bite block ?Placement Confirmation: positive ETCO2 ?Tube secured with: Tape ?Dental Injury: Teeth and Oropharynx as per pre-operative assessment  ? ? ? ? ?

## 2022-03-28 NOTE — Progress Notes (Signed)
Update to History and Physical ? ?No marked change in status since office preop visit.  ?Vitals:  ? 03/28/22 0557  ?BP: (!) 129/92  ?Pulse: (!) 58  ?Resp: 15  ?Temp: 97.9 ?F (36.6 ?C)  ?SpO2: 99%  ? ? ?Patient examined.  ?OK to proceed with surgery.  ? ?

## 2022-03-28 NOTE — Discharge Instructions (Addendum)
Hi Madeline Rodgers,  ? ?I found two fibroids that were protruding into the endometrial cavity.  I removed as much as I could of each of them.  I did not any see polyp formation.  ? ?Everything went well.  ? ?We will determine a plan for your care at your post op appointment.  ? ?Madeline Half, MD ? ? ? ?Post Anesthesia Home Care Instructions ? ?Activity: ?Get plenty of rest for the remainder of the day. A responsible individual must stay with you for 24 hours following the procedure.  ?For the next 24 hours, DO NOT: ?-Drive a car ?-Paediatric nurse ?-Drink alcoholic beverages ?-Take any medication unless instructed by your physician ?-Make any legal decisions or sign important papers. ? ?Meals: ?Start with liquid foods such as gelatin or soup. Progress to regular foods as tolerated. Avoid greasy, spicy, heavy foods. If nausea and/or vomiting occur, drink only clear liquids until the nausea and/or vomiting subsides. Call your physician if vomiting continues. ? ?Special Instructions/Symptoms: ?Your throat may feel dry or sore from the anesthesia or the breathing tube placed in your throat during surgery. If this causes discomfort, gargle with warm salt water. The discomfort should disappear within 24 hours. ? ?If you had a scopolamine patch placed behind your ear for the management of post- operative nausea and/or vomiting: ? ?1. The medication in the patch is effective for 72 hours, after which it should be removed.  Wrap patch in a tissue and discard in the trash. Wash hands thoroughly with soap and water. ?2. You may remove the patch earlier than 72 hours if you experience unpleasant side effects which may include dry mouth, dizziness or visual disturbances. ?3. Avoid touching the patch. Wash your hands with soap and water after contact with the patch. ? ?Remove patch behind left ear by Thursday, Mar 31, 2022. ? ?DISCHARGE INSTRUCTIONS: HYSTEROSCOPY / ENDOMETRIAL ABLATION ?The following instructions have been prepared to  help you care for yourself upon your return home. ? ?May take Ibuprofen after 4 PM. ? ?May take stool softner while taking narcotic pain medication to prevent constipation.  Drink plenty of water. ? ?Personal hygiene: ? Use sanitary pads for vaginal drainage, not tampons. ? Shower the day after your procedure. ? NO tub baths, pools or Jacuzzis for 2-3 weeks. ? Wipe front to back after using the bathroom. ? ?Activity and limitations: ? Do NOT drive or operate any equipment for 24 hours. The effects of anesthesia are still present ?and drowsiness may result. ? Do NOT rest in bed all day. ? Walking is encouraged. ? Walk up and down stairs slowly. ? You may resume your normal activity in one to two days or as indicated by your physician. ?Sexual activity: NO intercourse for at least 2 weeks after the procedure, or as indicated by your ?Doctor. ? ?Diet: Eat a light meal as desired this evening. You may resume your usual diet tomorrow. ? ?Return to Work: You may resume your work activities in one to two days or as indicated by your ?Doctor. ? ?What to expect after your surgery: Expect to have vaginal bleeding/discharge for 2-3 days and ?spotting for up to 10 days. It is not unusual to have soreness for up to 1-2 weeks. You may have a ?slight burning sensation when you urinate for the first day. Mild cramps may continue for a couple of ?days. You may have a regular period in 2-6 weeks. ? ?Call your doctor for any of the following: ? Excessive vaginal  bleeding or clotting, saturating and changing one pad every hour. ? Inability to urinate 6 hours after discharge from hospital. ? Pain not relieved by pain medication. ? Fever of 100.4? F or greater. ? Unusual vaginal discharge or odor. ? ?

## 2022-03-28 NOTE — Op Note (Signed)
OPERATIVE REPORT ? ? ?PREOPERATIVE DIAGNOSES:   Postmenopausal bleeding, endometrial thickening.  ? ?POSTOPERATIVE DIAGNOSES:   Postmenopausal bleeding, endometrial thickening ? ?PROCEDURE:  Hysteroscopy with dilation and curettage and Myosure resection of partially submucous fibroids. ? ?SURGEON:  Lenard Galloway, MD ? ?ANESTHESIA:  LMA, paracervical block with 10 mL of 1% lidocaine. ? ?IV FLUIDS:   450 cc LR ? ?EBL:   10 cc ? ?URINE OUTPUT:   50 cc ? ?NORMAL SALINE DEFICIT:   365 cc ? ?COMPLICATIONS:  None. ? ?INDICATIONS FOR THE PROCEDURE:    ? ?The patient is a 51 year old [id number] Caucasian female on hormone replacement therapy who presented with postmenopausal bleeding.   ?Pelvic ultrasound showed multiple small intramural fibroids and an endometrium measuring 13.22 mm.  ?An endometrial biopsy showed inactive endometrium, which did not correlate with the ultrasound findings.  ?A plan is now made to proceed with a hysteroscopy with dilation and curettage and Myosurgical resection of endometrial mass; after risks, benefits and alternatives were reviewed. ? ?FINDINGS:  Exam under anesthesia revealed a small anteverted, mobile uterus.  No adnexal masses were noted.  ?The uterus was sounded to 8 cm.  ?Hysteroscopy showed two partially submucous fibroids, each approximately 1.5 cm, one on the posterior fundal wall and one on the anterior fundal wall.  ?The tubal ostia regions were normal. ?Endometrial currettings were minimal. ? ?SPECIMENS:  Submucous fibroid and endometrial curettings were sent to Pathology separately.  ? ?PROCEDURE IN DETAIL:  The patient was reidentified in the preoperative ?hold area.  She received TED hose and PAS stockings for DVT prophylaxis. ? ?In the operating room, the patient was placed in the dorsal lithotomy ?position and then an LMA anesthetic was introduced.  The patient's lower ?abdomen, vagina and perineum were sterilely prepped with Betadine and the  ?patient's bladder was  catheterized of urine.  She was sterilly draped. ? ?An exam under anesthesia was performed. ? ?A speculum was placed inside the vagina and a single-tooth tenaculum was ?placed on the anterior cervical lip.  A paracervical block was performed ?with a total of 10 mL of 1% lidocaine plain.  The uterus was sounded. ?The cervix was dilated to a #19 Pratt dilator.  The MyoSure hysteroscope ?was then inserted inside the uterine cavity under the continuous infusion of normal saline solution.   ?Findings are as noted above.  The Myosure Reach device was used to resect portions of the uterine fibroids, and the ?specimen was sent to Pathology. ? ?The cervix was further dilated to a #23 Pratt dilator.   ?The serrated and then sharp curettes were introduced into the uterine cavity and the endometrium was curetted in all 4 quadrants.   ?A minimal amount of endometrial curettings was obtained.  This specimen was sent to Pathology. ? ?The single-tooth tenaculum which had been placed on the anterior cervical lip was removed.     ?Hemostasis was good, and all of the vaginal instruments were removed. ? ?The patient was awakened and escorted to the recovery room in stable ?condition after she was cleansed of Betadine.  There were no complications to the procedure.   ?All needle, instrument and sponge counts were correct. ? ?Lenard Galloway, MD  ?

## 2022-03-29 LAB — SURGICAL PATHOLOGY

## 2022-03-30 ENCOUNTER — Encounter (HOSPITAL_BASED_OUTPATIENT_CLINIC_OR_DEPARTMENT_OTHER): Payer: Self-pay | Admitting: Obstetrics and Gynecology

## 2022-04-01 ENCOUNTER — Telehealth: Payer: Self-pay

## 2022-04-01 NOTE — Telephone Encounter (Signed)
Pt calling for advice re: if she could participate in Tennis today. She had Hyst/D&C on 03/28/22 but reports she is doing well, no concerns.  ? ?Per ML, if she is feeling up to it then it is OK.  ? ?Pt notified and voiced understanding.  ?

## 2022-04-05 NOTE — Progress Notes (Signed)
GYNECOLOGY  VISIT ?  ?HPI: ?51 y.o.   Married  Caucasian  female   ?[id number] with Patient's last menstrual period was 06/03/2019.   ?here for post op for D&C/Hyst w/ Myosure on 03/28/22.   ?Surgical indication was postmenopausal bleeding on HRT and endometrial thicknesss. ?She had resection of partially submucous fibroids.  ? ?Pathology report fragments of benign myometrium and inactive endometrium.  ? ?Minimal bleeding and cramping post surgery.  ? ?Her goal is to stop her HRT.  ?Thinks she has been on HRT for almost 2 years.  ? ?GYNECOLOGIC HISTORY: ?Patient's last menstrual period was 06/03/2019. ?Contraception: PM ?Menopausal hormone therapy: oral estradiol/progesterone ?Last mammogram: 12/17/20-neg birads 1 ?Last pap smear: 03/18/22-WNL, HPV- neg ?       ?OB History   ? ? Gravida  ?4  ? Para  ?2  ? Term  ?2  ? Preterm  ?   ? AB  ?2  ? Living  ?2  ?  ? ? SAB  ?1  ? IAB  ?1  ? Ectopic  ?   ? Multiple  ?   ? Live Births  ?2  ?   ?  ? Obstetric Comments  ?Twins MAB @ 3 months  ?  ? ?  ?    ? ?Patient Active Problem List  ? Diagnosis Date Noted  ? Routine general medical examination at a health care facility 04/07/2022  ? Postmenopausal bleeding 03/19/2022  ? Fibroids 06/04/2012  ? ? ?Past Medical History:  ?Diagnosis Date  ? Headache   ? before menopause pt had migraines  ? Miscarriage 2003  ? Normal spontaneous vaginal delivery   ? 2  ? Postmenopausal bleeding 02/2022  ? Patient is on HRT.  ? Serum calcium elevated   ? Wears glasses   ? ? ?Past Surgical History:  ?Procedure Laterality Date  ? CHOLECYSTECTOMY  2000  ? DILATATION & CURETTAGE/HYSTEROSCOPY WITH MYOSURE N/A 03/28/2022  ? Procedure: DILATATION & CURETTAGE/HYSTEROSCOPY WITH MYOSURE;  Surgeon: Nunzio Cobbs, MD;  Location: [facility];  Service: Gynecology;  Laterality: N/A;  ? [place] OF UTERUS  1990  ? ENDOMETRIAL BIOPSY  02/17/2022  ? normal  ? TONSILLECTOMY  1977  ? ? ?Current Outpatient Medications  ?Medication Sig  Dispense Refill  ? estradiol (ESTRACE) 0.5 MG tablet Take one tablet (0.5 mg) by mouth daily. (Patient taking differently: at bedtime. Take one tablet (0.5 mg) by mouth daily.) 90 tablet 3  ? ibuprofen (ADVIL) 800 MG tablet Take 1 tablet (800 mg total) by mouth every 8 (eight) hours as needed. 30 tablet 0  ? Multiple Vitamin (MULTIVITAMIN) capsule Take 1 capsule by mouth as needed.    ? progesterone (PROMETRIUM) 200 MG capsule Take 1 capsule (200 mg total) by mouth at bedtime. 90 capsule 3  ? ?No current facility-administered medications for this visit.  ?  ? ?ALLERGIES: Other ? ?Family History  ?Problem Relation Age of Onset  ? Diabetes Maternal Grandmother   ? Leukemia Son   ?     ALL  ? Lung cancer Maternal Uncle   ?     (SMOKER)  ? Stomach cancer Paternal Grandmother   ? Hypertension Brother   ? ? ?Social History  ? ?Socioeconomic History  ? Marital status: Married  ?  Spouse name: Not on file  ? Number of children: 2  ? Years of education: Not on file  ? Highest education level: Not on file  ?Occupational History  ?  Occupation: Health and safety inspector  ?Tobacco Use  ? Smoking status: Former  ?  Packs/day: 0.25  ?  Years: 2.00  ?  Pack years: 0.50  ?  Types: Cigarettes  ?  Quit date: 63  ?  Years since quitting: 33.3  ? Smokeless tobacco: Never  ?Vaping Use  ? Vaping Use: Never used  ?Substance and Sexual Activity  ? Alcohol use: Yes  ?  Alcohol/week: 14.0 standard drinks  ?  Types: 14 Glasses of wine per week  ?  Comment: 2 per day  ? Drug use: Not Currently  ? Sexual activity: Yes  ?  Birth control/protection: Post-menopausal  ?  Comment: 1st intercourse 51 yo-More than 5 partners  ?Other Topics Concern  ? Not on file  ?Social History Narrative  ? Not on file  ? ?Social Determinants of Health  ? ?Financial Resource Strain: Not on file  ?Food Insecurity: Not on file  ?Transportation Needs: Not on file  ?Physical Activity: Not on file  ?Stress: Not on file  ?Social Connections: Not on file  ?Intimate Partner Violence:  Not on file  ? ? ?Review of Systems  ?All other systems reviewed and are negative. ? ?PHYSICAL EXAMINATION:   ? ?BP 118/82 (BP Location: Left Arm, Patient Position: Sitting, Cuff Size: Normal)   Wt 177 lb (80.3 kg)   LMP 06/03/2019   BMI 26.91 kg/m?     ?General appearance: alert, cooperative and appears stated age ? ?Pelvic: External genitalia:  no lesions ?             Urethra:  normal appearing urethra with no masses, tenderness or lesions ?             Bartholins and Skenes: normal    ?             Vagina: normal appearing vagina with normal color and discharge, no lesions ?             Cervix: no lesions.  Small amount of yellow clear discharge from the cervical os.  ?               ?Bimanual Exam:  Uterus:  normal size, contour, position, consistency, mobility, non-tender ?             Adnexa: no mass, fullness, tenderness ?              ?Chaperone was present for exam:  yes.  ? ?ASSESSMENT ? ?Status post hysteroscopic resection of partially submucous fibroids. ? ?PLAN ? ?Surgical findings, procedure, and pathology report reviewed.  ?She will update her mammogram.  ?Continue HRT.  She will let me know if she wants to start weaning and cut her dosage of each her estrogen and progesterone in half. ?Follow up for annual exam and prn recurrent postmenopausal bleeding. ?  ?An After Visit Summary was printed and given to the patient. ? ? ? ?

## 2022-04-07 ENCOUNTER — Encounter: Payer: Self-pay | Admitting: Internal Medicine

## 2022-04-07 ENCOUNTER — Ambulatory Visit: Payer: Managed Care, Other (non HMO) | Admitting: Internal Medicine

## 2022-04-07 VITALS — BP 102/80 | HR 74 | Resp 18 | Ht 68.0 in | Wt 173.0 lb

## 2022-04-07 DIAGNOSIS — Z0000 Encounter for general adult medical examination without abnormal findings: Secondary | ICD-10-CM | POA: Diagnosis not present

## 2022-04-07 DIAGNOSIS — Z1211 Encounter for screening for malignant neoplasm of colon: Secondary | ICD-10-CM | POA: Diagnosis not present

## 2022-04-07 NOTE — Assessment & Plan Note (Signed)
Flu shot up to date. covid-19 up to date. Shingrix complete. Tetanus up to date. Colonoscopy referral to GI done. Mammogram up to date, pap smear up to date. Counseled about sun safety and mole surveillance. Counseled about the dangers of distracted driving. Given 10 year screening recommendations.  ? ?

## 2022-04-07 NOTE — Progress Notes (Signed)
? ?  Subjective:  ? ?Patient ID: Madeline Rodgers, female    DOB: [birth year], 51 y.o.   MRN: [id number] ? ?HPI ?The patient is a new 51 YO female coming in for physical. ? ?PMH, [facility], social history reviewed and updated ? ?Review of Systems  ?Constitutional: Negative.   ?HENT: Negative.    ?Eyes: Negative.   ?Respiratory:  Negative for cough, chest tightness and shortness of breath.   ?Cardiovascular:  Negative for chest pain, palpitations and leg swelling.  ?Gastrointestinal:  Negative for abdominal distention, abdominal pain, constipation, diarrhea, nausea and vomiting.  ?Musculoskeletal: Negative.   ?Skin: Negative.   ?Neurological: Negative.   ?Psychiatric/Behavioral: Negative.    ? ?Objective:  ?Physical Exam ?Constitutional:   ?   Appearance: She is well-developed.  ?HENT:  ?   Head: Normocephalic and atraumatic.  ?Cardiovascular:  ?   Rate and Rhythm: Normal rate and regular rhythm.  ?Pulmonary:  ?   Effort: Pulmonary effort is normal. No respiratory distress.  ?   Breath sounds: Normal breath sounds. No wheezing or rales.  ?Abdominal:  ?   General: Bowel sounds are normal. There is no distension.  ?   Palpations: Abdomen is soft.  ?   Tenderness: There is no abdominal tenderness. There is no rebound.  ?Musculoskeletal:  ?   Cervical back: Normal range of motion.  ?Skin: ?   General: Skin is warm and dry.  ?Neurological:  ?   Mental Status: She is alert and oriented to person, place, and time.  ?   Coordination: Coordination normal.  ? ? ?Vitals:  ? 04/07/22 1106  ?BP: 102/80  ?Pulse: 74  ?Resp: 18  ?SpO2: 97%  ?Weight: 173 lb (78.5 kg)  ?Height: '5\' 8"'$  (1.727 m)  ? ? ?Assessment & Plan:  ? ?

## 2022-04-07 NOTE — Patient Instructions (Signed)
We will get you in for the colonoscopy. ? ? ?

## 2022-04-11 ENCOUNTER — Other Ambulatory Visit: Payer: Self-pay | Admitting: Obstetrics and Gynecology

## 2022-04-11 ENCOUNTER — Ambulatory Visit (INDEPENDENT_AMBULATORY_CARE_PROVIDER_SITE_OTHER): Payer: Managed Care, Other (non HMO) | Admitting: Obstetrics and Gynecology

## 2022-04-11 ENCOUNTER — Encounter: Payer: Self-pay | Admitting: Obstetrics and Gynecology

## 2022-04-11 VITALS — BP 118/82 | Wt 177.0 lb

## 2022-04-11 DIAGNOSIS — D219 Benign neoplasm of connective and other soft tissue, unspecified: Secondary | ICD-10-CM

## 2022-04-11 DIAGNOSIS — Z79890 Hormone replacement therapy (postmenopausal): Secondary | ICD-10-CM

## 2022-04-11 DIAGNOSIS — Z9889 Other specified postprocedural states: Secondary | ICD-10-CM

## 2022-04-11 DIAGNOSIS — Z1231 Encounter for screening mammogram for malignant neoplasm of breast: Secondary | ICD-10-CM

## 2022-04-21 ENCOUNTER — Ambulatory Visit
Admission: RE | Admit: 2022-04-21 | Discharge: 2022-04-21 | Disposition: A | Discharge status: Home / Self Care | Payer: Managed Care, Other (non HMO) | Source: Ambulatory Visit | Attending: Obstetrics and Gynecology | Admitting: Obstetrics and Gynecology

## 2022-04-21 DIAGNOSIS — Z1231 Encounter for screening mammogram for malignant neoplasm of breast: Secondary | ICD-10-CM

## 2022-04-26 ENCOUNTER — Other Ambulatory Visit: Payer: Self-pay | Admitting: Obstetrics and Gynecology

## 2022-04-26 DIAGNOSIS — R928 Other abnormal and inconclusive findings on diagnostic imaging of breast: Secondary | ICD-10-CM

## 2022-05-03 ENCOUNTER — Other Ambulatory Visit: Payer: Managed Care, Other (non HMO)

## 2022-05-06 ENCOUNTER — Ambulatory Visit: Payer: Managed Care, Other (non HMO)

## 2022-05-06 ENCOUNTER — Ambulatory Visit
Admission: RE | Admit: 2022-05-06 | Discharge: 2022-05-06 | Disposition: A | Discharge status: Home / Self Care | Payer: Managed Care, Other (non HMO) | Source: Ambulatory Visit | Attending: Obstetrics and Gynecology | Admitting: Obstetrics and Gynecology

## 2022-05-06 DIAGNOSIS — R928 Other abnormal and inconclusive findings on diagnostic imaging of breast: Secondary | ICD-10-CM

## 2022-05-20 ENCOUNTER — Ambulatory Visit: Payer: Managed Care, Other (non HMO) | Admitting: Internal Medicine

## 2022-05-20 ENCOUNTER — Encounter: Payer: Self-pay | Admitting: Internal Medicine

## 2022-05-20 DIAGNOSIS — R200 Anesthesia of skin: Secondary | ICD-10-CM | POA: Diagnosis not present

## 2022-05-20 DIAGNOSIS — R202 Paresthesia of skin: Secondary | ICD-10-CM

## 2023-01-11 IMAGING — MG MM DIGITAL DIAGNOSTIC UNILAT*R* W/ TOMO W/ CAD
6 series · 6 of 18 positions shown · non-contrast
Comparison: Previous exam(s).

CLINICAL DATA: Recall from screening mammography, possible
asymmetries in the RIGHT breast visible only on the CC view.

EXAM:
DIGITAL DIAGNOSTIC UNILATERAL RIGHT MAMMOGRAM WITH TOMOSYNTHESIS AND
CAD
TECHNIQUE: Right digital diagnostic mammography and breast tomosynthesis was
performed. The images were evaluated with computer-aided detection.

[R CC synth-2D (1 of 2)]
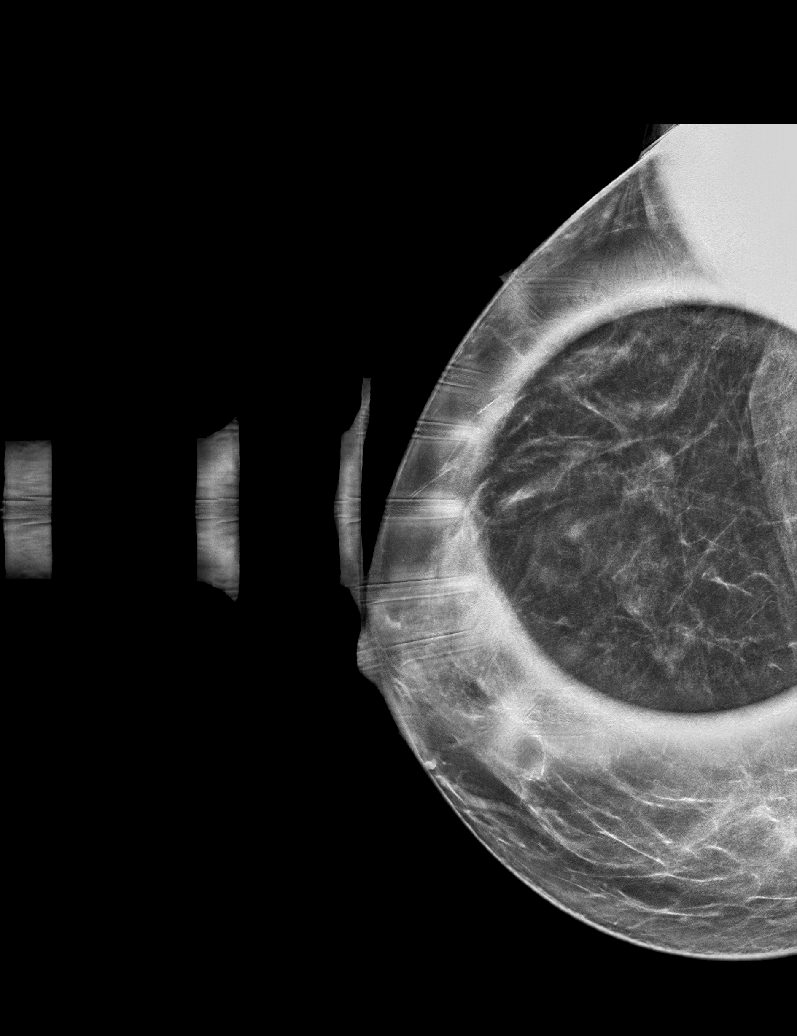

[R ML synth-2D]
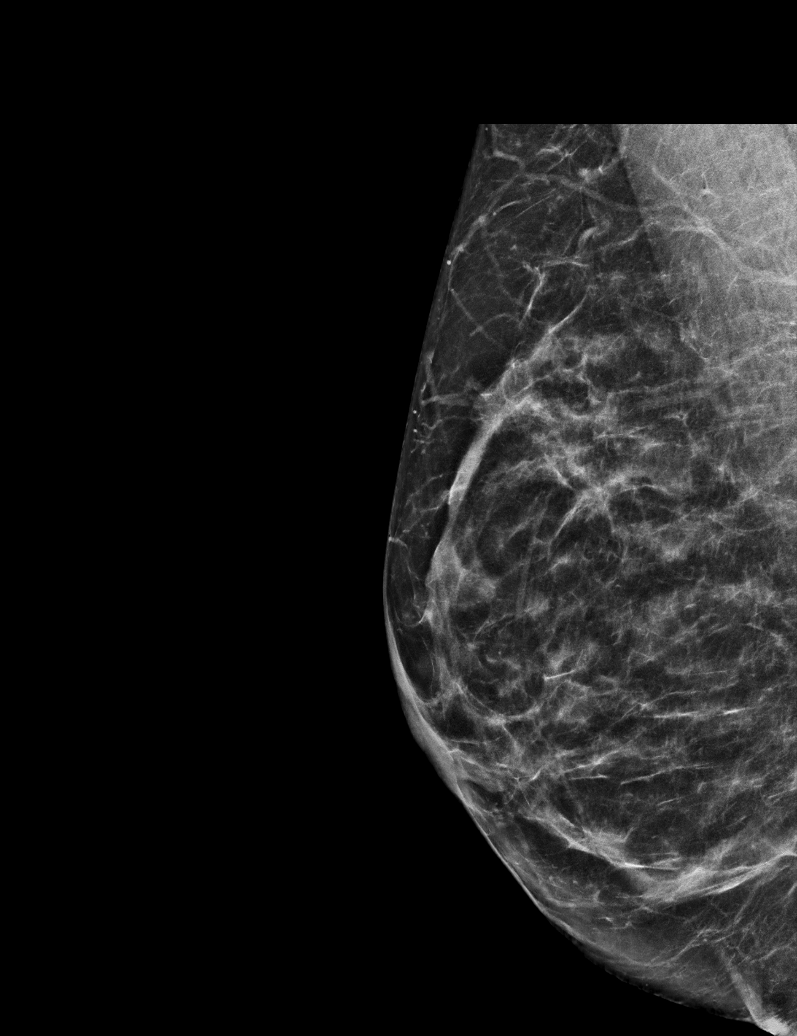

[R CC synth-2D (2 of 2)]
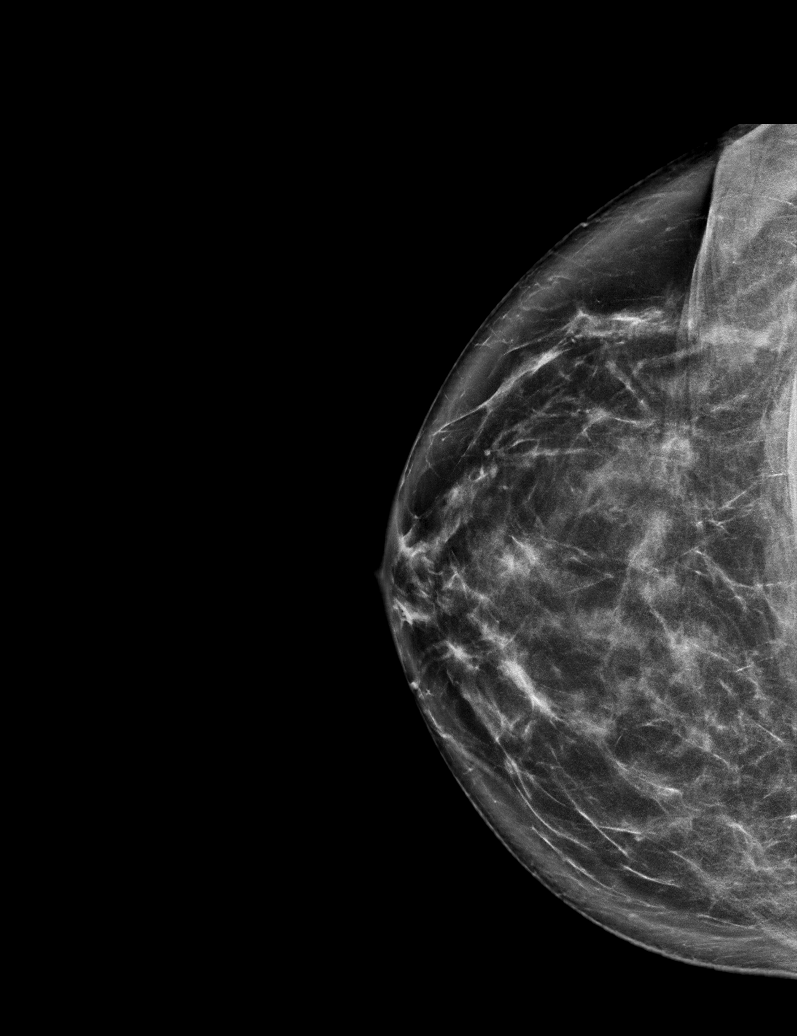

[R ML tomo · tomo slice 33/64.0]
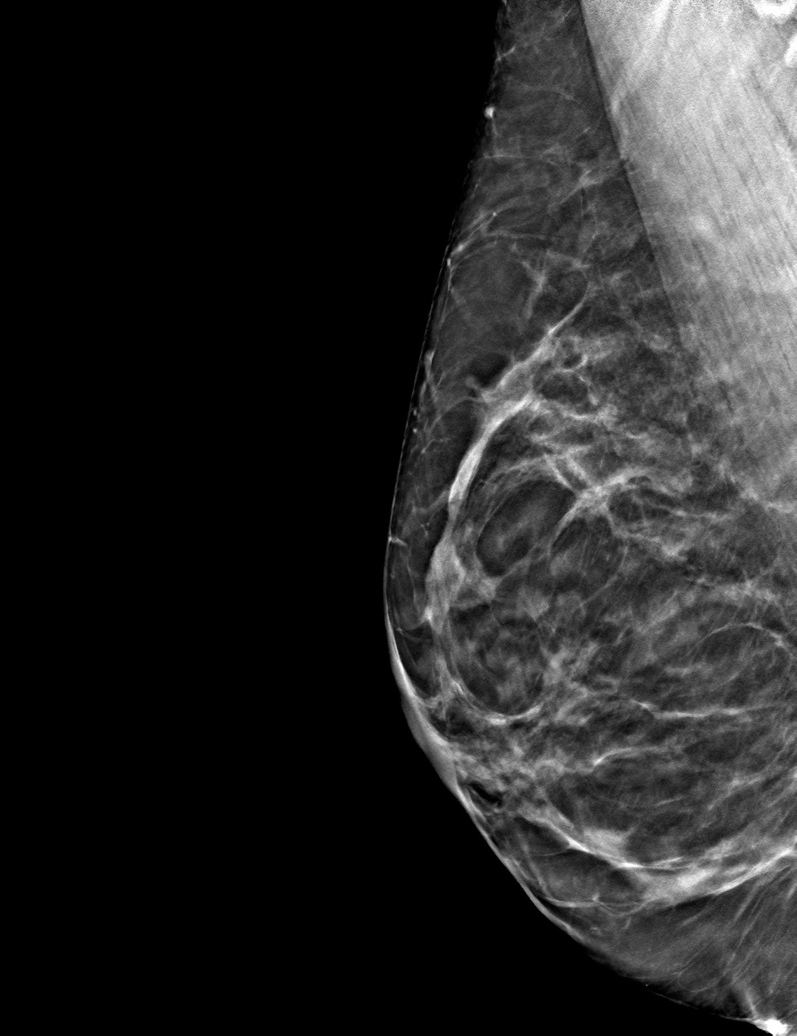

[R CC tomo (1 of 2) · tomo slice 39/76.0]
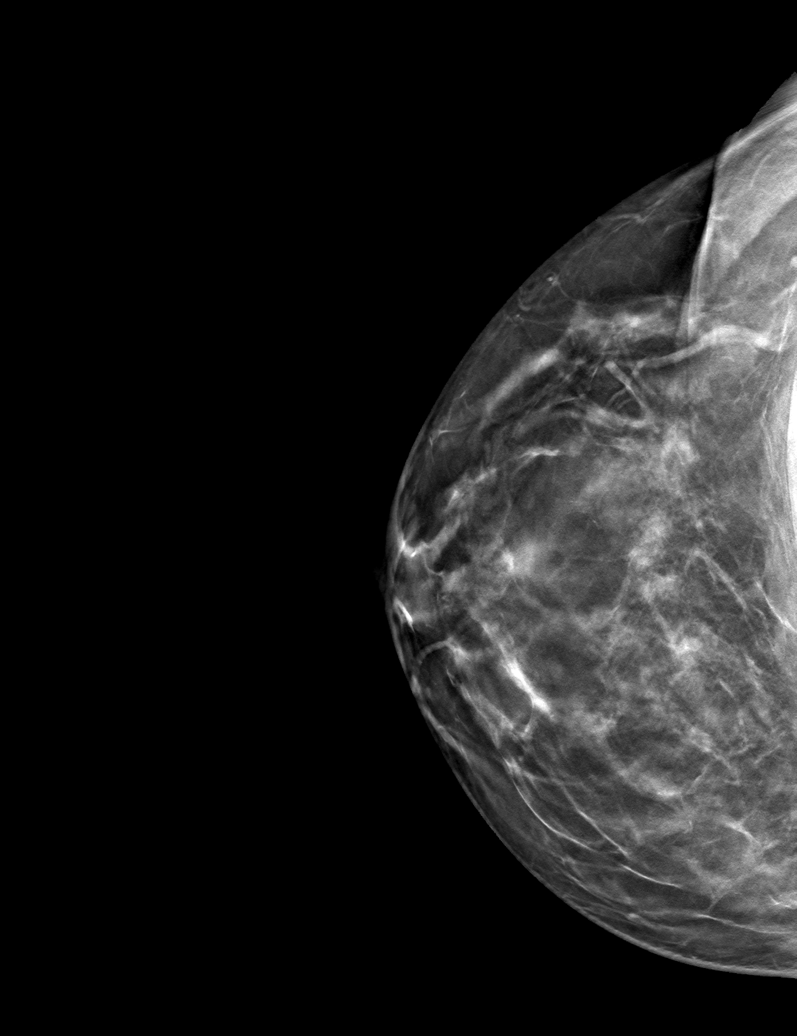

[R CC tomo (2 of 2) · tomo slice 32/63.0]
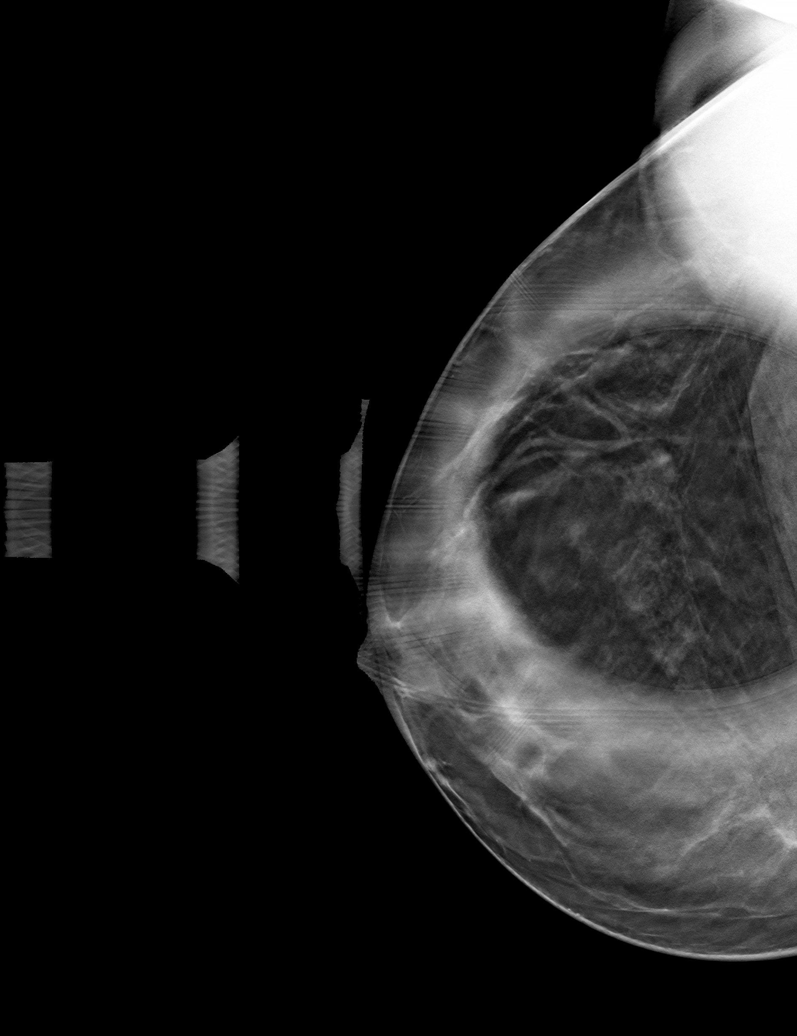

[6 of 18 positions shown; findings below may reference images not displayed]

ACR Breast Density Category c: The breast tissue is heterogeneously
dense, which may obscure small masses.
FINDINGS: Spot-compression CC view of the areas of concern and full field
mediolateral and medially rolled CC views were obtained.

The asymmetry in the outer breast at middle to posterior depth
localizes to the upper breast on the full field mediolateral view
and corresponds to an island of fibroglandular tissue in the UPPER
OUTER QUADRANT which has been present on multiple prior mammograms
and is unchanged.

The asymmetry in the retroareolar location at middle to posterior
depth disperses with compression, indicating overlapping
fibroglandular tissue. This is confirmed on the medially rolled CC
view. There is no underlying mass or architectural distortion.

No findings suspicious for malignancy.
IMPRESSION: No mammographic evidence of malignancy involving the RIGHT breast.

RECOMMENDATION:
Screening mammogram in one year.(Code:DJ-[id number])

I have discussed the findings and recommendations with the patient.
If applicable, a reminder letter will be sent to the patient
regarding the next appointment.

BI-RADS CATEGORY  1: Negative.

## 2023-02-06 ENCOUNTER — Other Ambulatory Visit: Payer: Self-pay

## 2023-02-06 NOTE — Telephone Encounter (Signed)
MMG 04/21/22 neg  Last AEX 10/18/2021.  Not scheduled.  Patient called in voice mail asking about "replacement refill on HRT".  I first sent message to appt desk to schedule her AEX and then I will call her.

## 2023-02-06 NOTE — Telephone Encounter (Addendum)
AEX scheduled 03/27/23. Last AEX 10/19/2021.  MMG 04/21/2022 negative.   Pharmacy confirmed.

## 2023-02-07 MED ORDER — PROGESTERONE 200 MG PO CAPS: 200.0000 mg | ORAL_CAPSULE | Freq: Every day | ORAL | 0 refills | Status: DC

## 2023-02-07 MED ORDER — ESTRADIOL 0.5 MG PO TABS: 0.5000 mg | ORAL_TABLET | Freq: Every day | ORAL | 0 refills | Status: DC

## 2023-03-06 ENCOUNTER — Other Ambulatory Visit: Payer: Self-pay

## 2023-03-06 MED ORDER — ESTRADIOL 0.5 MG PO TABS
0.5000 mg | ORAL_TABLET | Freq: Every day | ORAL | 0 refills | Status: DC
Start: 2023-03-06 — End: 2023-04-03

## 2023-03-06 NOTE — Telephone Encounter (Signed)
Last AEX 10/18/2021  Scheduled AEX 03/27/2023 MMG 04/21/2022 negative  Has plenty of progesterone left but has come up short on estradiol and only has 4 days left. Requests 30 days supply until visit.

## 2023-03-13 NOTE — Progress Notes (Deleted)
52 y.o. [id number] Married Caucasian female here for annual exam.    PCP:     Patient's last menstrual period was 06/03/2019.           Sexually active: {yes no:314532}  The current method of family planning is post menopausal status.    Exercising: {yes no:314532}  {types:19826} Smoker:  former  Health Maintenance: Pap:  03/18/22 neg: HR HPV neg, 10/07/14 neg History of abnormal Pap:  no MMG:  05/06/22 Breast Density Cat C, BI-RADS CAT 1 neg Colonoscopy:  n/a BMD:   n/a  Result  n/a TDaP:  10/07/14 Gardasil:   no HIV: n/a Hep C: n/a Screening Labs:  Hb today: ***, Urine today: ***   reports that she quit smoking about 34 years ago. Her smoking use included cigarettes. She has a 0.50 pack-year smoking history. She has never used smokeless tobacco. She reports current alcohol use of about 14.0 standard drinks of alcohol per week. She reports that she does not currently use drugs.  Past Medical History:  Diagnosis Date   Headache    before menopause pt had migraines   Miscarriage 2003   Normal spontaneous vaginal delivery    2   Postmenopausal bleeding 02/2022   Patient is on HRT.   Serum calcium elevated    Wears glasses     Past Surgical History:  Procedure Laterality Date   CHOLECYSTECTOMY  2000   DILATATION & CURETTAGE/HYSTEROSCOPY WITH MYOSURE N/A 03/28/2022   Procedure: DILATATION & CURETTAGE/HYSTEROSCOPY WITH MYOSURE;  Surgeon: Patton Salles, MD;  Location: [facility];  Service: Gynecology;  Laterality: N/A;   DILATION AND CURETTAGE OF UTERUS  1990   ENDOMETRIAL BIOPSY  02/17/2022   normal   TONSILLECTOMY  1977    Current Outpatient Medications  Medication Sig Dispense Refill   estradiol (ESTRACE) 0.5 MG tablet Take 1 tablet (0.5 mg total) by mouth daily. Take one tablet (0.5 mg) by mouth daily. 30 tablet 0   ibuprofen (ADVIL) 800 MG tablet Take 1 tablet (800 mg total) by mouth every 8 (eight) hours as needed. 30 tablet 0   Multiple  Vitamin (MULTIVITAMIN) capsule Take 1 capsule by mouth as needed.     progesterone (PROMETRIUM) 200 MG capsule Take 1 capsule (200 mg total) by mouth at bedtime. 90 capsule 0   No current facility-administered medications for this visit.    Family History  Problem Relation Age of Onset   Diabetes Maternal Grandmother    Leukemia Son        ALL   Lung cancer Maternal Uncle        (SMOKER)   Stomach cancer Paternal Grandmother    Hypertension Brother     Review of Systems  Exam:   LMP 06/03/2019     General appearance: alert, cooperative and appears stated age Head: normocephalic, without obvious abnormality, atraumatic Neck: no adenopathy, supple, symmetrical, trachea midline and thyroid normal to inspection and palpation Lungs: clear to auscultation bilaterally Breasts: normal appearance, no masses or tenderness, No nipple retraction or dimpling, No nipple discharge or bleeding, No axillary adenopathy Heart: regular rate and rhythm Abdomen: soft, non-tender; no masses, no organomegaly Extremities: extremities normal, atraumatic, no cyanosis or edema Skin: skin color, texture, turgor normal. No rashes or lesions Lymph nodes: cervical, supraclavicular, and axillary nodes normal. Neurologic: grossly normal  Pelvic: External genitalia:  no lesions              No abnormal inguinal nodes palpated.  Urethra:  normal appearing urethra with no masses, tenderness or lesions              Bartholins and Skenes: normal                 Vagina: normal appearing vagina with normal color and discharge, no lesions              Cervix: no lesions              Pap taken: {yes no:314532} Bimanual Exam:  Uterus:  normal size, contour, position, consistency, mobility, non-tender              Adnexa: no mass, fullness, tenderness              Rectal exam: {yes no:314532}.  Confirms.              Anus:  normal sphincter tone, no lesions  Chaperone was present for exam:   ***  Assessment:   Well woman visit with gynecologic exam.   Plan: Mammogram screening discussed. Self breast awareness reviewed. Pap and HR HPV as above. Guidelines for Calcium, Vitamin D, regular exercise program including cardiovascular and weight bearing exercise.   Follow up annually and prn.   Additional counseling given.  {yes [id number]. _______ minutes face to face time of which over 50% was spent in counseling.    After visit summary provided.

## 2023-03-27 ENCOUNTER — Ambulatory Visit: Payer: Managed Care, Other (non HMO) | Admitting: Obstetrics and Gynecology

## 2023-04-03 ENCOUNTER — Other Ambulatory Visit: Payer: Self-pay

## 2023-04-03 ENCOUNTER — Other Ambulatory Visit: Payer: Self-pay | Admitting: Obstetrics and Gynecology

## 2023-04-03 DIAGNOSIS — Z1231 Encounter for screening mammogram for malignant neoplasm of breast: Secondary | ICD-10-CM

## 2023-04-03 NOTE — Telephone Encounter (Signed)
Last AEX 10/18/2021 MMG 05/06/2022 negative  AEX is scheduled 06/06/23 at 10am. MMG is scheduled 05/04/23 at 2:40pm.  Patient requesting 90 days refill on her HRT.

## 2023-04-04 MED ORDER — ESTRADIOL 0.5 MG PO TABS: 0.5000 mg | ORAL_TABLET | Freq: Every day | ORAL | 0 refills | Status: DC

## 2023-04-04 MED ORDER — PROGESTERONE 200 MG PO CAPS: 200.0000 mg | ORAL_CAPSULE | Freq: Every day | ORAL | 0 refills | Status: DC

## 2023-04-05 ENCOUNTER — Other Ambulatory Visit: Payer: Self-pay | Admitting: Obstetrics and Gynecology

## 2023-04-07 ENCOUNTER — Encounter: Payer: Self-pay | Admitting: Internal Medicine

## 2023-04-07 ENCOUNTER — Ambulatory Visit: Payer: Managed Care, Other (non HMO) | Admitting: Internal Medicine

## 2023-04-07 VITALS — BP 116/80 | HR 81 | Temp 98.3°F | Ht 68.0 in | Wt 174.0 lb

## 2023-04-07 DIAGNOSIS — M25561 Pain in right knee: Secondary | ICD-10-CM | POA: Diagnosis not present

## 2023-04-07 NOTE — Progress Notes (Signed)
   Subjective:   Patient ID: Madeline Rodgers, female    DOB: June 30, 1971, 52 y.o.   MRN: [id number]  HPI The patient is a 52 YO female coming in for right knee pain after playing tennis. Did swell to double at least size. Is down now. This happened last week. Swelling and pain next day.  Review of Systems  Constitutional:  Positive for activity change. Negative for appetite change, chills, fatigue, fever and unexpected weight change.  Respiratory: Negative.    Cardiovascular: Negative.   Gastrointestinal: Negative.   Musculoskeletal:  Positive for arthralgias, joint swelling and myalgias. Negative for back pain and gait problem.  Skin: Negative.   Neurological: Negative.     Objective:  Physical Exam Constitutional:      Appearance: She is well-developed.  HENT:     Head: Normocephalic and atraumatic.  Cardiovascular:     Rate and Rhythm: Normal rate and regular rhythm.  Pulmonary:     Effort: Pulmonary effort is normal. No respiratory distress.     Breath sounds: Normal breath sounds. No wheezing or rales.  Abdominal:     General: Bowel sounds are normal. There is no distension.     Palpations: Abdomen is soft.     Tenderness: There is no abdominal tenderness. There is no rebound.  Musculoskeletal:        General: Tenderness present.     Cervical back: Normal range of motion.     Comments: Right knee tenderness distal to patella ACL/PCL intact mild to moderate effusion  Skin:    General: Skin is warm and dry.  Neurological:     Mental Status: She is alert and oriented to person, place, and time.     Coordination: Coordination normal.     Vitals:   04/07/23 0947  BP: 116/80  Pulse: 81  Temp: 98.3 F (36.8 C)  TempSrc: Oral  SpO2: 98%  Weight: 174 lb (78.9 kg)  Height: 5\' 8"  (1.727 m)    Assessment & Plan:

## 2023-04-07 NOTE — Patient Instructions (Signed)
Do what you're doing for the next few weeks and if not improving call us we can get you in with sports medicine.

## 2023-04-07 NOTE — Assessment & Plan Note (Signed)
Suspect partial tear of meniscus. Advised conservative care and explained to patient. If no improvement in 2-4 weeks recommend sports medicine and she will call back if needed.

## 2023-04-18 NOTE — Progress Notes (Unsigned)
Madeline Payor, PhD, LAT, ATC acting as a scribe for Madeline Graham, MD.  Madeline Rodgers is a 52 y.o. female who presents to [facility] today for R knee pain ongoing since end of April-early May. She noticed the knee pain after playing some tennis. 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 Pt locates pain to varying locations around the anterior aspect.   R Knee swelling: no- has resolved, but will swell up after a day of work Mechanical symptoms: yes Aggravates: any lateral movement Treatments tried: knee brace, Epsom salt bath, IBU, heat, cold  Pertinent review of systems: No fevers or chills  Relevant historical information: Otherwise healthy   Exam:  BP 122/84   Pulse 79   Ht 5\' 8"  (1.727 m)   Wt 172 lb (78 kg)   LMP 06/03/2019   SpO2 97%   BMI 26.15 kg/m  General: Well Developed, well nourished, and in no acute distress.   MSK: Right knee mild effusion otherwise normal. Normal motion with minimal crepitation. Tender palpation medial and lateral joint line. Stable ligamentous exam. Intact strength. Positive lateral McMurray's test.    Lab and Radiology Results  Procedure: Real-time Ultrasound Guided Injection of right knee joint superior lateral patellar space Device: Philips Affiniti 50G Images permanently stored and available for review in PACS Ultrasound evaluation prior to injection reveals mild degenerative changes especially medial joint line.  No Baker's cyst.  No large effusion. Verbal informed consent obtained.  Discussed risks and benefits of procedure. Warned about infection, bleeding, hyperglycemia damage to structures among others. Patient expresses understanding and agreement Time-out conducted.   Noted no overlying erythema, induration, or other signs of local infection.   Skin prepped in a sterile fashion.   Local anesthesia: Topical Ethyl chloride.   With sterile technique and under real time  ultrasound guidance: 40 mg of Kenalog and 2 mL Marcaine injected into knee joint. Fluid seen entering the joint capsule.   Completed without difficulty   Pain immediately resolved suggesting accurate placement of the medication.   Advised to call if fevers/chills, erythema, induration, drainage, or persistent bleeding.   Images permanently stored and available for review in the ultrasound unit.  Impression: Technically successful ultrasound guided injection.   X-ray images right knee ordered today but not obtained.      Assessment and Plan: 52 y.o. 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  Unfortunately I think she forgot to get her x-ray on the way out today as I do not really know about the extent of her arthritis.  She does have degenerative appearing changes on ultrasound.  Plan for steroid injection.  Consider PT.  She will send me an update with how she feels.   PDMP not reviewed this encounter. Orders Placed This Encounter  Procedures   DG Knee AP/LAT W/Sunrise Right    Standing Status:   Future    Standing Expiration Date:   05/20/2023    Order Specific Question:   Reason for Exam (SYMPTOM  OR DIAGNOSIS REQUIRED)    Answer:   right knee pain    Order Specific Question:   Preferred imaging location?    Answer:   [facility]    Order Specific Question:   Is patient pregnant?    Answer:   No   [place] LIMITED JOINT SPACE STRUCTURES LOW RIGHT(NO LINKED CHARGES)    Order Specific Question:   Reason  for Exam (SYMPTOM  OR DIAGNOSIS REQUIRED)    Answer:   right knee pain    Order Specific Question:   Preferred imaging location?    Answer:   [facility]   No orders of the defined types were placed in this encounter.    Discussed warning signs or symptoms. Please see discharge instructions. Patient expresses understanding.   The above documentation has been reviewed and is accurate and complete Madeline Rodgers, M.D.

## 2023-04-19 ENCOUNTER — Other Ambulatory Visit: Payer: Self-pay

## 2023-04-19 ENCOUNTER — Ambulatory Visit: Payer: Managed Care, Other (non HMO) | Admitting: Family Medicine

## 2023-04-19 VITALS — BP 122/84 | HR 79 | Ht 68.0 in | Wt 172.0 lb

## 2023-04-19 DIAGNOSIS — M25561 Pain in right knee: Secondary | ICD-10-CM | POA: Diagnosis not present

## 2023-04-19 NOTE — Patient Instructions (Addendum)
Thank you for coming in today.   Please use Voltaren gel (Generic Diclofenac Gel) up to 4x daily for pain as needed.  This is available over-the-counter as both the name brand Voltaren gel and the generic diclofenac gel.   You received an injection today. Seek immediate medical attention if the joint becomes red, extremely painful, or is oozing fluid.   Please get an Xray today before you leave   Let me know if this is not working.  Next step may be MRI.   I can repeat this same injection every 3 months if needed.

## 2023-05-04 ENCOUNTER — Ambulatory Visit
Admission: RE | Admit: 2023-05-04 | Discharge: 2023-05-04 | Disposition: A | Discharge status: Home / Self Care | Payer: Managed Care, Other (non HMO) | Source: Ambulatory Visit | Attending: Obstetrics and Gynecology | Admitting: Obstetrics and Gynecology

## 2023-05-04 ENCOUNTER — Ambulatory Visit: Payer: Managed Care, Other (non HMO)

## 2023-05-04 DIAGNOSIS — Z1231 Encounter for screening mammogram for malignant neoplasm of breast: Secondary | ICD-10-CM

## 2023-05-09 ENCOUNTER — Other Ambulatory Visit: Payer: Self-pay | Admitting: Obstetrics and Gynecology

## 2023-05-09 DIAGNOSIS — R928 Other abnormal and inconclusive findings on diagnostic imaging of breast: Secondary | ICD-10-CM

## 2023-05-18 ENCOUNTER — Ambulatory Visit: Payer: Managed Care, Other (non HMO)

## 2023-05-18 ENCOUNTER — Ambulatory Visit
Admission: RE | Admit: 2023-05-18 | Discharge: 2023-05-18 | Disposition: A | Discharge status: Home / Self Care | Payer: Managed Care, Other (non HMO) | Source: Ambulatory Visit | Attending: Obstetrics and Gynecology | Admitting: Obstetrics and Gynecology

## 2023-05-18 DIAGNOSIS — R928 Other abnormal and inconclusive findings on diagnostic imaging of breast: Secondary | ICD-10-CM

## 2023-05-19 ENCOUNTER — Telehealth: Payer: Self-pay

## 2023-05-19 DIAGNOSIS — N950 Postmenopausal bleeding: Secondary | ICD-10-CM

## 2023-05-19 NOTE — Telephone Encounter (Signed)
BS pt calling to report intermittent light bleeding for ~57month now but noticed heavy vaginal bleeding starting this AM, states bled through super plus tampon in ~2hrs. Also reports some cramping along with it. S/p hyst/D&C w/ myosure 03/28/2022 d/t PMB on HRT and endometrial thickness. She has had resection of partially submucous fibroids. Path was benign.  Pt reports still taking HRT. Estrace 0.5mg  tab and prometrium 200mg  daily/nightly. Please advise.

## 2023-05-19 NOTE — Telephone Encounter (Signed)
Per ML: "Stop Estrace tab and continue on Prometrium 200 mg HS only.  Ibuprofen 800 mg PO every 8 hours.  Schedule an office visit next week. Dr. Elbert Ewings"    Pt notified and voiced understanding. Will send msg to appt desk.

## 2023-05-23 NOTE — Telephone Encounter (Signed)
If her bleeding is heavy, >1 pad/hour she needs to go to the ER. Otherwise I think she is okay waiting to see Dr Edward Jolly in 13 days. I do think we should try and get her in for an ultrasound prior to her visit with Dr Edward Jolly. If the U/S can't be done here, please try and schedule it at one of the other Radiology options.

## 2023-05-23 NOTE — Telephone Encounter (Signed)
Pt reports that red bleeding is still continuing after discontinuing her estrace. Has not really been taking ibuprofen regularly, only PRN for cramping.   I doubled checked to make sure that my msg was sent to appt desk on 05/19/23 and was sent but pt reports has not yet received a call. She states she is scheduled for her AEX w/ BS on 7/9 and wasn't sure if she should be seen sooner or not? I wanted to inquire first prior to rechecking with appt desk? Please advise. Thanks.

## 2023-05-23 NOTE — Progress Notes (Deleted)
52 y.o. [id number] Married Caucasian female here for annual exam.    PCP:     Patient's last menstrual period was 06/03/2019.           Sexually active: {yes no:314532}  The current method of family planning is post menopausal status.    Exercising: {yes no:314532}  {types:19826} Smoker:  former  Health Maintenance: Pap:  03/18/22 WNL, 10/07/14 neg History of abnormal Pap:  no MMG:  05/18/23 Breast Density Cat B, BI-RADS CAT 1 neg Colonoscopy:  n/a BMD:   n/a  Result  n/a TDaP:  10/07/14 Gardasil:   no HIV: n/a Hep C: n/a Screening Labs:  Hb today: ***, Urine today: ***   reports that she quit smoking about 34 years ago. Her smoking use included cigarettes. She has a 0.50 pack-year smoking history. She has never used smokeless tobacco. She reports current alcohol use of about 14.0 standard drinks of alcohol per week. She reports that she does not currently use drugs.  Past Medical History:  Diagnosis Date   Headache    before menopause pt had migraines   Miscarriage 2003   Normal spontaneous vaginal delivery    2   Postmenopausal bleeding 02/2022   Patient is on HRT.   Serum calcium elevated    Wears glasses     Past Surgical History:  Procedure Laterality Date   CHOLECYSTECTOMY  2000   DILATATION & CURETTAGE/HYSTEROSCOPY WITH MYOSURE N/A 03/28/2022   Procedure: DILATATION & CURETTAGE/HYSTEROSCOPY WITH MYOSURE;  Surgeon: Patton Salles, MD;  Location: [facility];  Service: Gynecology;  Laterality: N/A;   DILATION AND CURETTAGE OF UTERUS  1990   ENDOMETRIAL BIOPSY  02/17/2022   normal   TONSILLECTOMY  1977    Current Outpatient Medications  Medication Sig Dispense Refill   estradiol (ESTRACE) 0.5 MG tablet Take 1 tablet (0.5 mg total) by mouth daily. Take one tablet (0.5 mg) by mouth daily. 90 tablet 0   ibuprofen (ADVIL) 800 MG tablet Take 1 tablet (800 mg total) by mouth every 8 (eight) hours as needed. 30 tablet 0   Multiple Vitamin  (MULTIVITAMIN) capsule Take 1 capsule by mouth as needed.     progesterone (PROMETRIUM) 200 MG capsule Take 1 capsule (200 mg total) by mouth at bedtime. 90 capsule 0   No current facility-administered medications for this visit.    Family History  Problem Relation Age of Onset   Diabetes Maternal Grandmother    Leukemia Son        ALL   Lung cancer Maternal Uncle        (SMOKER)   Stomach cancer Paternal Grandmother    Hypertension Brother     Review of Systems  Exam:   LMP 06/03/2019     General appearance: alert, cooperative and appears stated age Head: normocephalic, without obvious abnormality, atraumatic Neck: no adenopathy, supple, symmetrical, trachea midline and thyroid normal to inspection and palpation Lungs: clear to auscultation bilaterally Breasts: normal appearance, no masses or tenderness, No nipple retraction or dimpling, No nipple discharge or bleeding, No axillary adenopathy Heart: regular rate and rhythm Abdomen: soft, non-tender; no masses, no organomegaly Extremities: extremities normal, atraumatic, no cyanosis or edema Skin: skin color, texture, turgor normal. No rashes or lesions Lymph nodes: cervical, supraclavicular, and axillary nodes normal. Neurologic: grossly normal  Pelvic: External genitalia:  no lesions              No abnormal inguinal nodes palpated.  Urethra:  normal appearing urethra with no masses, tenderness or lesions              Bartholins and Skenes: normal                 Vagina: normal appearing vagina with normal color and discharge, no lesions              Cervix: no lesions              Pap taken: {yes no:314532} Bimanual Exam:  Uterus:  normal size, contour, position, consistency, mobility, non-tender              Adnexa: no mass, fullness, tenderness              Rectal exam: {yes no:314532}.  Confirms.              Anus:  normal sphincter tone, no lesions  Chaperone was present for exam:  ***  Assessment:    Well woman visit with gynecologic exam.   Plan: Mammogram screening discussed. Self breast awareness reviewed. Pap and HR HPV as above. Guidelines for Calcium, Vitamin D, regular exercise program including cardiovascular and weight bearing exercise.   Follow up annually and prn.   Additional counseling given.  {yes [id number]. _______ minutes face to face time of which over 50% was spent in counseling.    After visit summary provided.

## 2023-05-24 NOTE — Telephone Encounter (Signed)
Pt notified and voiced understanding. Will reach out to appt desk to see if any availability for [place] in office prior to pt appt w/ BS and if not, will place order for pt to have US done @ an external location.

## 2023-05-29 NOTE — Telephone Encounter (Signed)
05/25/2023: Jennye Moccasin, CMA Left message for patient to call and schedule appointment."

## 2023-05-30 NOTE — Telephone Encounter (Signed)
Per ML: "She can discuss Pelvic US with Dr Edward Jolly at her coming up visit. Dr. Elbert Ewings"  Pt scheduled for 06/06/2023. Will route to provider for final review.

## 2023-05-30 NOTE — Telephone Encounter (Signed)
Angelyn Punt D, CMA Two messages have been left for patient to call and schedule [place] only prior to July 9; patient has not called back.  Will route to provider for f/u directions.

## 2023-05-31 NOTE — Addendum Note (Signed)
Addended by: Jodelle Red D on: 05/31/2023 10:14 AM   Modules accepted: Orders

## 2023-05-31 NOTE — Telephone Encounter (Signed)
Pt returned call stating that she has been working non-stop for the past 10 days which has made it hard for her to take care of her self. Has requested for an order for a pelvic US to be placed to an external facility so can be done prior to appt w/ BS for next week. Order placed.

## 2023-06-02 ENCOUNTER — Ambulatory Visit (HOSPITAL_COMMUNITY)
Admission: RE | Admit: 2023-06-02 | Discharge: 2023-06-02 | Disposition: A | Discharge status: Home / Self Care | Payer: Managed Care, Other (non HMO) | Source: Ambulatory Visit | Attending: Obstetrics and Gynecology | Admitting: Obstetrics and Gynecology

## 2023-06-02 DIAGNOSIS — N950 Postmenopausal bleeding: Secondary | ICD-10-CM | POA: Diagnosis present

## 2023-06-05 NOTE — Telephone Encounter (Signed)
US performed on 06/02/2023. Report not yet ready for review.

## 2023-06-06 ENCOUNTER — Ambulatory Visit: Payer: Managed Care, Other (non HMO) | Admitting: Radiology

## 2023-06-06 NOTE — Telephone Encounter (Signed)
I can't make any recommendations until I see the report. Schedule for follow up with Madeline Rodgers when she returns

## 2023-06-06 NOTE — Telephone Encounter (Signed)
Pt notified and voiced understanding. Will send msg to appt desk.  

## 2023-06-06 NOTE — Telephone Encounter (Signed)
Pt LVM in triage line stating that she r/s her appt w/ BS from 7/9-7/10 w/ JC due to work/schedule conflict. However, wanted to inquire about [place] results for PMB and whether or not she can restart taking her estradiol. States she has been only taking the progesterone now x 1.5-2 weeks.   Looks as if pt was actually scheduled for today @ 9am and not tomorrow. Also, [place] report has not yet been received. I can call pt and notify her of this info but what would you suggest about the HRT? Please advise. Thanks.

## 2023-06-07 ENCOUNTER — Telehealth: Payer: Self-pay

## 2023-06-07 NOTE — Telephone Encounter (Signed)
Call from patient asking if pelvic US report was in yet. [place] was done 06/02/23 and . [place] report still not available to view. Tried to call patient back to notify her but her voice mail was full. Could not leave a message.

## 2023-06-08 ENCOUNTER — Ambulatory Visit: Payer: Managed Care, Other (non HMO) | Admitting: Radiology

## 2023-06-08 NOTE — Telephone Encounter (Signed)
See result note from today, 06/08/23.

## 2023-06-08 NOTE — Telephone Encounter (Signed)
Spoke w/ pt this AM and she is really upset about appt confusion, the fact that her [place] report is not back yet (was told by [place] tech at external location that results would be ready by 7/9; I advised pt that last I was told by radiology reception desk, they were ~10 days on reading [place] reports).  Pt advised that she was on JC's schedule for today for an AEX (happy to see her for AEXs since BS's AEXs are scheduling out into 09/2023). However, JC wants pt to f/u w/ Dr. Edward Jolly on PMB concern. Pt reports today appt was supposed to be for yesterday, which would make the 2nd time that her appt dates/times were not what she agreed to.  Pt reported that she is upset that her care is not being "taken as seriously as she feels it should be and is considering changing practices."  Please advise.

## 2023-06-09 NOTE — Telephone Encounter (Signed)
See result note from 06/08/23.    I can see the patient at 4:00 pm on 06/12/23.

## 2023-06-12 ENCOUNTER — Ambulatory Visit: Payer: Managed Care, Other (non HMO) | Admitting: Obstetrics and Gynecology

## 2023-06-13 NOTE — Progress Notes (Deleted)
GYNECOLOGY  VISIT   HPI: 52 y.o.   Married  Caucasian  female   [phone] with Patient's last menstrual period was 06/03/2019.   here for   PMB and U/S results  GYNECOLOGIC HISTORY: Patient's last menstrual period was 06/03/2019. Contraception:  PMP Menopausal hormone therapy:  estrace and prometrium Last mammogram:  05/18/23 Breast Density Cat B, BI-RADS CAT 1 neg Last pap smear:   03/18/22 neg: HR HPV neg        OB History     Gravida  4   Para  2   Term  2   Preterm      AB  2   Living  2      SAB  1   IAB  1   Ectopic      Multiple      Live Births  2        Obstetric Comments  Twins MAB @ 3 months            Patient Active Problem List   Diagnosis Date Noted   Right knee pain 04/07/2023   Numbness and tingling of right arm 05/20/2022   Routine general medical examination at a health care facility 04/07/2022   Postmenopausal bleeding 03/19/2022   Fibroids 06/04/2012    Past Medical History:  Diagnosis Date   Headache    before menopause pt had migraines   Miscarriage 2003   Normal spontaneous vaginal delivery    2   Postmenopausal bleeding 02/2022   Patient is on HRT.   Serum calcium elevated    Wears glasses     Past Surgical History:  Procedure Laterality Date   CHOLECYSTECTOMY  2000   DILATATION & CURETTAGE/HYSTEROSCOPY WITH MYOSURE N/A 03/28/2022   Procedure: DILATATION & CURETTAGE/HYSTEROSCOPY WITH MYOSURE;  Surgeon: Patton Salles, MD;  Location: [facility];  Service: Gynecology;  Laterality: N/A;   DILATION AND CURETTAGE OF UTERUS  1990   ENDOMETRIAL BIOPSY  02/17/2022   normal   TONSILLECTOMY  1977    Current Outpatient Medications  Medication Sig Dispense Refill   estradiol (ESTRACE) 0.5 MG tablet Take 1 tablet (0.5 mg total) by mouth daily. Take one tablet (0.5 mg) by mouth daily. 90 tablet 0   ibuprofen (ADVIL) 800 MG tablet Take 1 tablet (800 mg total) by mouth every 8 (eight) hours as needed. 30  tablet 0   Multiple Vitamin (MULTIVITAMIN) capsule Take 1 capsule by mouth as needed.     progesterone (PROMETRIUM) 200 MG capsule Take 1 capsule (200 mg total) by mouth at bedtime. 90 capsule 0   No current facility-administered medications for this visit.     ALLERGIES: Other  Family History  Problem Relation Age of Onset   Diabetes Maternal Grandmother    Leukemia Son        ALL   Lung cancer Maternal Uncle        (SMOKER)   Stomach cancer Paternal Grandmother    Hypertension Brother     Social History   Socioeconomic History   Marital status: Married    Spouse name: Not on file   Number of children: 2   Years of education: Not on file   Highest education level: Not on file  Occupational History   Occupation: Art therapist  Tobacco Use   Smoking status: Former    Current packs/day: 0.00    Average packs/day: 0.3 packs/day for 2.0 years (0.5 ttl pk-yrs)    Types:  Cigarettes    Start date: 51    Quit date: 1990    Years since quitting: 34.5   Smokeless tobacco: Never  Vaping Use   Vaping status: Never Used  Substance and Sexual Activity   Alcohol use: Yes    Alcohol/week: 14.0 standard drinks of alcohol    Types: 14 Glasses of wine per week    Comment: 2 per day   Drug use: Not Currently   Sexual activity: Yes    Birth control/protection: Post-menopausal    Comment: 1st intercourse 52 yo-More than 5 partners  Other Topics Concern   Not on file  Social History Narrative   Not on file   Social Determinants of Health   Financial Resource Strain: Not on file  Food Insecurity: Not on file  Transportation Needs: Not on file  Physical Activity: Not on file  Stress: Not on file  Social Connections: Unknown (04/12/2022)   Received from [facility]   Social Network    Social Network: Not on file  Intimate Partner Violence: Unknown (03/04/2022)   Received from [facility]   HITS    Physically Hurt: Not on file    Insult or Talk Down To: Not on file     Threaten Physical Harm: Not on file    Scream or Curse: Not on file    Review of Systems  PHYSICAL EXAMINATION:    LMP 06/03/2019     General appearance: alert, cooperative and appears stated age Head: Normocephalic, without obvious abnormality, atraumatic Neck: no adenopathy, supple, symmetrical, trachea midline and thyroid normal to inspection and palpation Lungs: clear to auscultation bilaterally Breasts: normal appearance, no masses or tenderness, No nipple retraction or dimpling, No nipple discharge or bleeding, No axillary or supraclavicular adenopathy Heart: regular rate and rhythm Abdomen: soft, non-tender, no masses,  no organomegaly Extremities: extremities normal, atraumatic, no cyanosis or edema Skin: Skin color, texture, turgor normal. No rashes or lesions Lymph nodes: Cervical, supraclavicular, and axillary nodes normal. No abnormal inguinal nodes palpated Neurologic: Grossly normal  Pelvic: External genitalia:  no lesions              Urethra:  normal appearing urethra with no masses, tenderness or lesions              Bartholins and Skenes: normal                 Vagina: normal appearing vagina with normal color and discharge, no lesions              Cervix: no lesions                Bimanual Exam:  Uterus:  normal size, contour, position, consistency, mobility, non-tender              Adnexa: no mass, fullness, tenderness              Rectal exam: {yes no:314532}.  Confirms.              Anus:  normal sphincter tone, no lesions  Chaperone was present for exam:  ***  ASSESSMENT     PLAN     An After Visit Summary was printed and given to the patient.  ______ minutes face to face time of which over 50% was spent in counseling.

## 2023-06-15 ENCOUNTER — Encounter: Payer: Self-pay | Admitting: Obstetrics and Gynecology

## 2023-06-15 ENCOUNTER — Ambulatory Visit: Payer: Managed Care, Other (non HMO) | Admitting: Obstetrics and Gynecology

## 2023-06-15 VITALS — BP 126/84 | HR 82 | Ht 65.0 in | Wt 166.0 lb

## 2023-06-15 DIAGNOSIS — Z0000 Encounter for general adult medical examination without abnormal findings: Secondary | ICD-10-CM

## 2023-06-15 DIAGNOSIS — N950 Postmenopausal bleeding: Secondary | ICD-10-CM

## 2023-06-15 DIAGNOSIS — Z01419 Encounter for gynecological examination (general) (routine) without abnormal findings: Secondary | ICD-10-CM | POA: Diagnosis not present

## 2023-06-15 DIAGNOSIS — Z1211 Encounter for screening for malignant neoplasm of colon: Secondary | ICD-10-CM

## 2023-06-15 MED ORDER — PROGESTERONE MICRONIZED 100 MG PO CAPS: 100.0000 mg | ORAL_CAPSULE | Freq: Every day | ORAL | 0 refills | Status: DC

## 2023-06-15 NOTE — Patient Instructions (Signed)

## 2023-06-15 NOTE — Progress Notes (Unsigned)
GYNECOLOGY  VISIT   HPI: 52 y.o.   Married  Caucasian  female   [phone] with Patient's last menstrual period was 06/03/2019.   here for   PMB and U/S results, also AEX.  Hx recurrent postmenopausal bleeding.  Currently bleeding off and on.  Status post hysteroscopy with dilation and curettage and Myosure resection of partially submucous fibroids on 03/28/22.  Final pathology report showed benign myometrium and inactive endometrium with breakdown and degenerative changes.  No atypia or malignancy were noted.  Post op, she has been on estradiol 0.5 mg daily and Prometrium 200 mg q hs.    She was advised to stop the estradiol abut 3 weeks ago, and her bleeding is now minimal, per patient.   Not having hot flashes or night sweats, but nothing significant.  Her goal is to stop all hormonal treatment.   She had an ultrasound 06/08/23 due to her postmenopausal bleeding.   Narrative & Impression  CLINICAL DATA:  Postmenopausal bleeding   EXAM: TRANSABDOMINAL AND TRANSVAGINAL ULTRASOUND OF PELVIS   TECHNIQUE: Both transabdominal and transvaginal ultrasound examinations of the pelvis were performed. Transabdominal technique was performed for global imaging of the pelvis including uterus, ovaries, adnexal regions, and pelvic cul-de-sac. It was necessary to proceed with endovaginal exam following the transabdominal exam to visualize the endometrium and adnexal structures.   COMPARISON:  02/17/2022, 06/03/2021   FINDINGS: Uterus   Measurements: 7.8 x 4.5 x 4.9 cm = volume: 89.9 mL. Uterus is heterogeneous, with multiple suspected fibroids identified. Largest in the posterior aspect of the uterine body measuring up to 4.1 cm.   Endometrium   Thickness: Not well visualized. The endometrial stripe is not identified. It is unclear whether this is due to atrophy or distortion from the adjacent uterine fibroids.   Right ovary   Measurements: 1.6 x 0.8 by 1.3 cm = volume: 0.9 mL.  Normal appearance/no adnexal mass.   Left ovary   Measurements: 2.1 x 1.0 x 1.3 cm = volume: 1.3 mL. Normal appearance/no adnexal mass.   Other findings   No abnormal free fluid.   IMPRESSION: 1. Multiple uterine fibroids, largest measuring up to 4.1 cm. 2. Suboptimal visualization of the endometrium, either due to atrophy or distortion from the adjacent fibroids. If bleeding remains unresponsive to hormonal or medical therapy, sonohysterogram should be considered for focal lesion work-up. (Ref: Radiological Reasoning: Algorithmic Workup of Abnormal Vaginal Bleeding with Endovaginal Sonography and Sonohysterography. AJR 2008; 454:U98-11)     Electronically Signed   By: Sharlet Salina M.D.   On: 06/08/2023 11:57      GYNECOLOGIC HISTORY: Patient's last menstrual period was 06/03/2019. Contraception:  PMP Menopausal hormone therapy:  Prometrium Last mammogram:  05/18/23 Breast Density Cat B, BI-RADS CAT 1 neg Last pap smear:   03/18/22 neg: HR HPV neg.  No hx abnormal pap.   COLON CANCER SCREENING: Colonoscopy:  N/A        OB History     Gravida  4   Para  2   Term  2   Preterm      AB  2   Living  2      SAB  1   IAB  1   Ectopic      Multiple      Live Births  2        Obstetric Comments  Twins MAB @ 3 months            Patient Active Problem List  Diagnosis Date Noted   Right knee pain 04/07/2023   Numbness and tingling of right arm 05/20/2022   Routine general medical examination at a health care facility 04/07/2022   Postmenopausal bleeding 03/19/2022   Fibroids 06/04/2012    Past Medical History:  Diagnosis Date   Headache    before menopause pt had migraines   Miscarriage 2003   Normal spontaneous vaginal delivery    2   Postmenopausal bleeding 02/2022   Patient is on HRT.   Serum calcium elevated    Wears glasses     Past Surgical History:  Procedure Laterality Date   CHOLECYSTECTOMY  2000   DILATATION &  CURETTAGE/HYSTEROSCOPY WITH MYOSURE N/A 03/28/2022   Procedure: DILATATION & CURETTAGE/HYSTEROSCOPY WITH MYOSURE;  Surgeon: Patton Salles, MD;  Location: [facility];  Service: Gynecology;  Laterality: N/A;   DILATION AND CURETTAGE OF UTERUS  1990   ENDOMETRIAL BIOPSY  02/17/2022   normal   TONSILLECTOMY  1977    Current Outpatient Medications  Medication Sig Dispense Refill   ibuprofen (ADVIL) 800 MG tablet Take 1 tablet (800 mg total) by mouth every 8 (eight) hours as needed. 30 tablet 0   Multiple Vitamin (MULTIVITAMIN) capsule Take 1 capsule by mouth as needed.     progesterone (PROMETRIUM) 200 MG capsule Take 1 capsule (200 mg total) by mouth at bedtime. 90 capsule 0   estradiol (ESTRACE) 0.5 MG tablet Take 1 tablet (0.5 mg total) by mouth daily. Take one tablet (0.5 mg) by mouth daily. (Patient not taking: Reported on 06/15/2023) 90 tablet 0   No current facility-administered medications for this visit.     ALLERGIES: Other  Family History  Problem Relation Age of Onset   Diabetes Maternal Grandmother    Leukemia Son        ALL   Lung cancer Maternal Uncle        (SMOKER)   Stomach cancer Paternal Grandmother    Hypertension Brother     Social History   Socioeconomic History   Marital status: Married    Spouse name: Not on file   Number of children: 2   Years of education: Not on file   Highest education level: Not on file  Occupational History   Occupation: Art therapist  Tobacco Use   Smoking status: Former    Current packs/day: 0.00    Average packs/day: 0.3 packs/day for 2.0 years (0.5 ttl pk-yrs)    Types: Cigarettes    Start date: 56    Quit date: 1990    Years since quitting: 34.5   Smokeless tobacco: Never  Vaping Use   Vaping status: Never Used  Substance and Sexual Activity   Alcohol use: Yes    Alcohol/week: 14.0 standard drinks of alcohol    Types: 14 Glasses of wine per week    Comment: 2 per day   Drug use: Not  Currently   Sexual activity: Yes    Birth control/protection: Post-menopausal    Comment: 1st intercourse 52 yo-More than 5 partners  Other Topics Concern   Not on file  Social History Narrative   Not on file   Social Determinants of Health   Financial Resource Strain: Not on file  Food Insecurity: Not on file  Transportation Needs: Not on file  Physical Activity: Not on file  Stress: Not on file  Social Connections: Unknown (04/12/2022)   [facility]   Social Network    Social Network: Not  on file  Intimate Partner Violence: Unknown (03/04/2022)   Received from [facility]   HITS    Physically Hurt: Not on file    Insult or Talk Down To: Not on file    Threaten Physical Harm: Not on file    Scream or Curse: Not on file    Review of Systems  Genitourinary:  Positive for vaginal bleeding.  All other systems reviewed and are negative.   PHYSICAL EXAMINATION:    BP 126/84 (BP Location: Right Arm, Patient Position: Sitting, Cuff Size: Normal)   Pulse 82   Ht 5\' 5"  (1.651 m)   Wt 166 lb (75.3 kg)   LMP 06/03/2019   SpO2 98%   BMI 27.62 kg/m     General appearance: alert, cooperative and appears stated age Head: Normocephalic, without obvious abnormality, atraumatic Neck: no adenopathy, supple, symmetrical, trachea midline and thyroid normal to inspection and palpation Lungs: clear to auscultation bilaterally Breasts: normal appearance, no masses or tenderness, No nipple retraction or dimpling, No nipple discharge or bleeding, No axillary or supraclavicular adenopathy Heart: regular rate and rhythm Abdomen: soft, non-tender, no masses,  no organomegaly Extremities: extremities normal, atraumatic, no cyanosis or edema Skin: Skin color, texture, turgor normal. No rashes or lesions Lymph nodes: Cervical, supraclavicular, and axillary nodes normal. No abnormal inguinal nodes palpated Neurologic: Grossly normal  Pelvic: External genitalia:  no lesions               Urethra:  normal appearing urethra with no masses, tenderness or lesions              Bartholins and Skenes: normal                 Vagina: normal appearing vagina with normal color and discharge, no lesions              Cervix: no lesions.  Red blood noted.                 Bimanual Exam:  Uterus:  normal size, contour, position, consistency, mobility, non-tender              Adnexa: no mass, fullness, tenderness              Rectal exam: yes.  Confirms.              Anus:  normal sphincter tone, no lesions  Chaperone was present for exam:  Warren Lacy, CMA  ASSESSMENT  Well woman visit with GYN exam. PMB on HRT.  Fibroids.   PLAN  Mammogam and self breast exam reviewed.  Pelvic US report reviewed.  Initially we discussed weaning off her HRT, but after her examination and bleeding noted, I recommended stopping all HRT now.  EMB if bleeding does not stop. Routine labs collected today.  Discussed importance of daily calcium 1200 mg and vit D at least 600 international units.  Referral for colonoscopy for colon cancer screening.  Follow up annually and prn.

## 2023-06-16 LAB — COMPREHENSIVE METABOLIC PANEL
AG Ratio: 2.3 (calc) (ref 1.0–2.5)
ALT: 28 U/L (ref 6–29)
AST: 27 U/L (ref 10–35)
Albumin: 4.9 g/dL (ref 3.6–5.1)
Alkaline phosphatase (APISO): 47 U/L (ref 37–153)
BUN: 15 mg/dL (ref 7–25)
CO2: 26 mmol/L (ref 20–32)
Calcium: 11 mg/dL — ABNORMAL HIGH (ref 8.6–10.4)
Chloride: 107 mmol/L (ref 98–110)
Creat: 0.76 mg/dL (ref 0.50–1.03)
Globulin: 2.1 g/dL (calc) (ref 1.9–3.7)
Glucose, Bld: 83 mg/dL (ref 65–99)
Potassium: 4.6 mmol/L (ref 3.5–5.3)
Sodium: 142 mmol/L (ref 135–146)
Total Bilirubin: 0.4 mg/dL (ref 0.2–1.2)
Total Protein: 7 g/dL (ref 6.1–8.1)

## 2023-06-16 LAB — LIPID PANEL
Cholesterol: 239 mg/dL — ABNORMAL HIGH (ref <200)
HDL: 119 mg/dL (ref >=50)
LDL Cholesterol (Calc): 101 mg/dL (calc) — ABNORMAL HIGH
Non-HDL Cholesterol (Calc): 120 mg/dL (calc) (ref <130)
Total CHOL/HDL Ratio: 2 (calc) (ref <5.0)
Triglycerides: 93 mg/dL (ref <150)

## 2023-06-16 LAB — CBC
HCT: 42.1 % (ref 35.0–45.0)
Hemoglobin: 13.9 g/dL (ref 11.7–15.5)
MCH: 31.7 pg (ref 27.0–33.0)
MCHC: 33 g/dL (ref 32.0–36.0)
MCV: 95.9 fL (ref 80.0–100.0)
MPV: 10.4 fL (ref 7.5–12.5)
Platelets: 279 10*3/uL (ref 140–400)
RBC: 4.39 10*6/uL (ref 3.80–5.10)
RDW: 12.5 % (ref 11.0–15.0)
WBC: 8.9 10*3/uL (ref 3.8–10.8)

## 2023-06-19 ENCOUNTER — Ambulatory Visit: Payer: Managed Care, Other (non HMO) | Admitting: Obstetrics and Gynecology

## 2023-11-15 ENCOUNTER — Ambulatory Visit (INDEPENDENT_AMBULATORY_CARE_PROVIDER_SITE_OTHER): Payer: Managed Care, Other (non HMO)

## 2023-11-15 ENCOUNTER — Ambulatory Visit: Payer: Managed Care, Other (non HMO) | Admitting: Family Medicine

## 2023-11-15 ENCOUNTER — Other Ambulatory Visit: Payer: Self-pay

## 2023-11-15 VITALS — BP 128/86 | HR 87 | Ht 65.0 in | Wt 173.0 lb

## 2023-11-15 DIAGNOSIS — G8929 Other chronic pain: Secondary | ICD-10-CM

## 2023-11-15 DIAGNOSIS — M25561 Pain in right knee: Secondary | ICD-10-CM | POA: Diagnosis not present

## 2023-11-15 NOTE — Patient Instructions (Addendum)
Thank you for coming in today.   Please get an Xray today before you leave   You received an injection today. Seek immediate medical attention if the joint becomes red, extremely painful, or is oozing fluid.   Please use Voltaren gel (Generic Diclofenac Gel) up to 4x daily for pain as needed.  This is available over-the-counter as both the name brand Voltaren gel and the generic diclofenac gel.    

## 2023-11-15 NOTE — Progress Notes (Signed)
   Rubin Payor, PhD, LAT, ATC acting as a scribe for [name], MD.  Madeline Rodgers is a 52 y.o. female who presents to [facility] today for cont'd R knee pain. Pt was last seen by Dr. Denyse Amass on 04/19/23 and was given a R knee steroid injection.  Today, pt reports prior steroid injection was helpful. She now notes significant mechanical symptoms. She relates the increase in knee pain to her playing tennis.   Pertinent review of systems: No fevers or chills  Relevant historical information: Otherwise healthy   Exam:  BP 128/86   Pulse 87   Ht 5\' 5"  (1.651 m)   Wt 173 lb (78.5 kg)   LMP 06/03/2019   SpO2 98%   BMI 28.79 kg/m  General: Well Developed, well nourished, and in no acute distress.   MSK: Right knee mild effusion normal motion with crepitation.  Tender palpation medial joint line.    Lab and Radiology Results  Procedure: Real-time Ultrasound Guided Injection of right knee joint superior lateral patella space Device: Philips Affiniti 50G/GE Logiq Images permanently stored and available for review in PACS Verbal informed consent obtained.  Discussed risks and benefits of procedure. Warned about infection, bleeding, hyperglycemia damage to structures among others. Patient expresses understanding and agreement Time-out conducted.   Noted no overlying erythema, induration, or other signs of local infection.   Skin prepped in a sterile fashion.   Local anesthesia: Topical Ethyl chloride.   With sterile technique and under real time ultrasound guidance: 40 mg of Kenalog and 2 mL of Marcaine injected into knee joint. Fluid seen entering the joint capsule.   Completed without difficulty   Pain immediately resolved suggesting accurate placement of the medication.   Advised to call if fevers/chills, erythema, induration, drainage, or persistent bleeding.   Images permanently stored and available for review in the ultrasound unit.   Impression: Technically successful ultrasound guided injection.   X-ray images right knee obtained today personally and independently interpreted Mild medial and patellofemoral DJD.  No acute fractures are visible. Await formal radiology review     Assessment and Plan: 52 y.o. female with chronic right knee pain.  This is an acute exacerbation of chronic problem due to mild degenerative changes and possibly a degenerative meniscus tear medial compartment.  Plan for repeat steroid injection today.  Continue Voltaren gel and Tylenol as needed.  Recommend a compressive knee sleeve to use with activity.  Check back as needed.   PDMP not reviewed this encounter. Orders Placed This Encounter  Procedures   [place] LIMITED JOINT SPACE STRUCTURES LOW RIGHT(NO LINKED CHARGES)    Reason for Exam (SYMPTOM  OR DIAGNOSIS REQUIRED):   right knee pain    Preferred imaging location?:   [facility] Knee AP/LAT W/Sun[place] Right    Standing Status:   Future    Number of Occurrences:   1    Expiration Date:   12/16/2023    Reason for Exam (SYMPTOM  OR DIAGNOSIS REQUIRED):   right knee pain    Preferred imaging location?:   [facility]    Is patient pregnant?:   No   No orders of the defined types were placed in this encounter.    Discussed warning signs or symptoms. Please see discharge instructions. Patient expresses understanding.   The above documentation has been reviewed and is accurate and complete [name], M.D.

## 2024-04-05 ENCOUNTER — Other Ambulatory Visit: Payer: Self-pay | Admitting: Internal Medicine

## 2024-04-05 DIAGNOSIS — Z1231 Encounter for screening mammogram for malignant neoplasm of breast: Secondary | ICD-10-CM

## 2024-05-20 ENCOUNTER — Ambulatory Visit
Admission: RE | Admit: 2024-05-20 | Discharge: 2024-05-20 | Disposition: A | Discharge status: Home / Self Care | Source: Ambulatory Visit | Attending: Internal Medicine | Admitting: Internal Medicine

## 2024-05-20 DIAGNOSIS — Z1231 Encounter for screening mammogram for malignant neoplasm of breast: Secondary | ICD-10-CM

## 2024-05-24 ENCOUNTER — Ambulatory Visit: Payer: Self-pay | Admitting: Internal Medicine

## 2024-05-24 LAB — HM MAMMOGRAPHY

## 2024-11-06 LAB — HM COLONOSCOPY

## 2025-01-15 ENCOUNTER — Ambulatory Visit: Admitting: Obstetrics and Gynecology
